# Patient Record
Sex: Female | Born: 2000 | Race: White | Hispanic: Yes | Marital: Single | Smoking: Never smoker
Health system: Southern US, Community
[De-identification: ages and names within clinical notes are randomized; demographics above are authoritative.]

## PROBLEM LIST (undated history)

## (undated) DIAGNOSIS — J45909 Unspecified asthma, uncomplicated: Secondary | ICD-10-CM

## (undated) DIAGNOSIS — T7840XA Allergy, unspecified, initial encounter: Secondary | ICD-10-CM

## (undated) DIAGNOSIS — F419 Anxiety disorder, unspecified: Secondary | ICD-10-CM

## (undated) DIAGNOSIS — L309 Dermatitis, unspecified: Secondary | ICD-10-CM

## (undated) DIAGNOSIS — L509 Urticaria, unspecified: Secondary | ICD-10-CM

## (undated) DIAGNOSIS — G8929 Other chronic pain: Secondary | ICD-10-CM

## (undated) DIAGNOSIS — R519 Headache, unspecified: Secondary | ICD-10-CM

## (undated) DIAGNOSIS — N200 Calculus of kidney: Secondary | ICD-10-CM

## (undated) HISTORY — DX: Other chronic pain: G89.29

## (undated) HISTORY — PX: NO PAST SURGERIES: SHX2092

## (undated) HISTORY — DX: Headache, unspecified: R51.9

## (undated) HISTORY — PX: WISDOM TOOTH EXTRACTION: SHX21

## (undated) HISTORY — DX: Unspecified asthma, uncomplicated: J45.909

## (undated) HISTORY — DX: Urticaria, unspecified: L50.9

## (undated) HISTORY — DX: Anxiety disorder, unspecified: F41.9

## (undated) HISTORY — DX: Calculus of kidney: N20.0

## (undated) HISTORY — DX: Allergy, unspecified, initial encounter: T78.40XA

## (undated) HISTORY — DX: Dermatitis, unspecified: L30.9

---
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
## 2019-01-20 ENCOUNTER — Other Ambulatory Visit: Payer: Self-pay

## 2019-01-20 MED ORDER — MONTELUKAST SODIUM 10 MG PO TABS: 10.0000 mg | ORAL_TABLET | Freq: Every day | ORAL | 3 refills | Status: DC

## 2019-01-26 ENCOUNTER — Ambulatory Visit: Payer: BC Managed Care – PPO | Admitting: Allergy and Immunology

## 2019-09-22 IMAGING — US US TRANSVAGINAL NON-OB
1 series · 13 of 25 positions shown · non-contrast
Comparison: None in PACs

CLINICAL DATA: Onset of right lower quadrant pain this morning.

EXAM:
TRANSABDOMINAL AND TRANSVAGINAL ULTRASOUND OF PELVIS
DOPPLER ULTRASOUND OF OVARIES
TECHNIQUE: Both transabdominal and transvaginal ultrasound examinations of the
pelvis were performed. Transabdominal technique was performed for
global imaging of the pelvis including uterus, ovaries, adnexal
regions, and pelvic cul-de-sac.
It was necessary to proceed with endovaginal exam following the
transabdominal exam to visualize the ovaries and adnexal regions..
Color and duplex Doppler ultrasound was utilized to evaluate blood
flow to the ovaries.

[Series 1: us transvaginal non-ob · 0.18mm/px · 125 acquisitions, 13 frames shown]
[im 1/125]
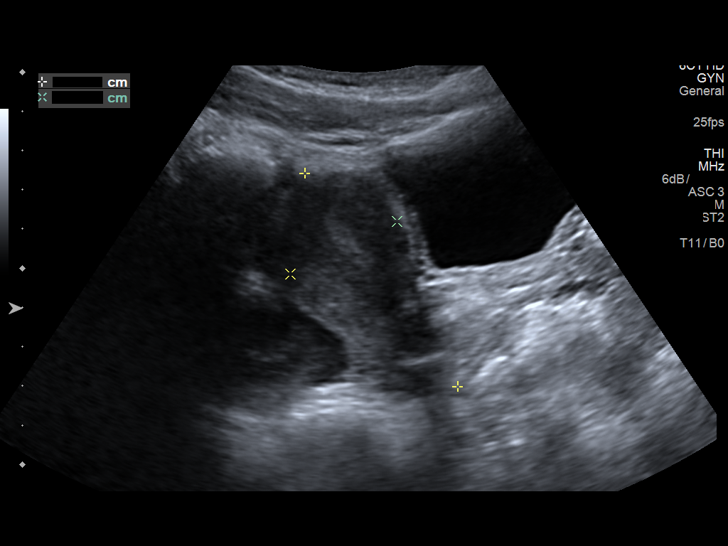
[im 11/125]
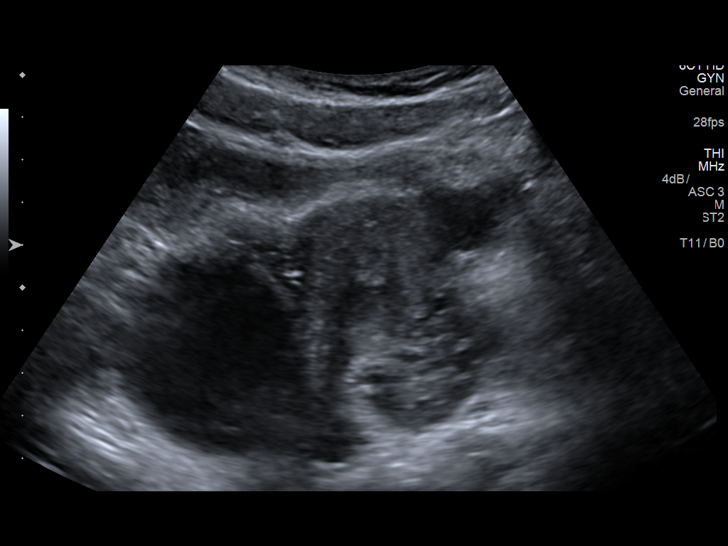
[im 21/125]
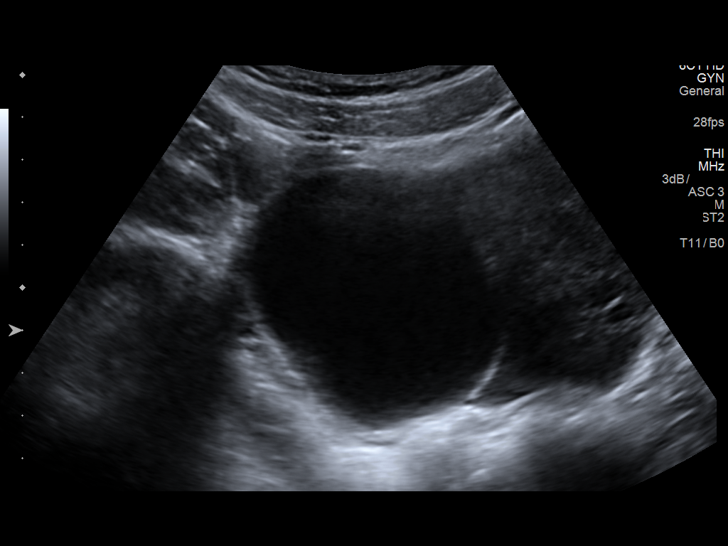
[im 32/125]
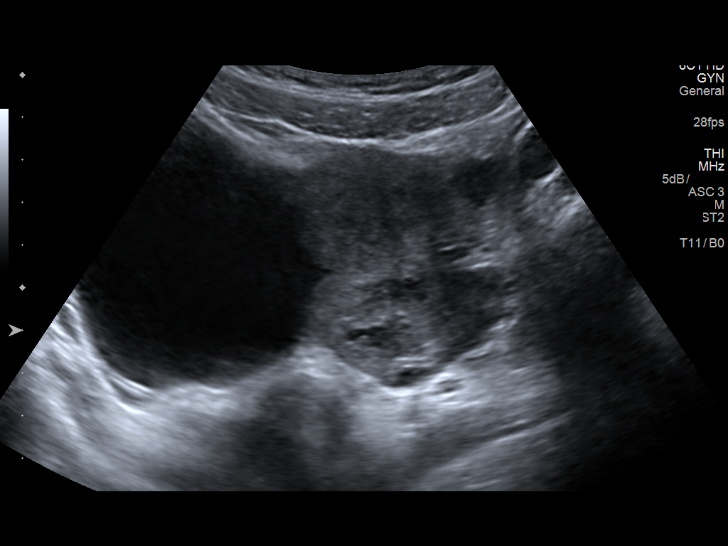
[im 42/125]
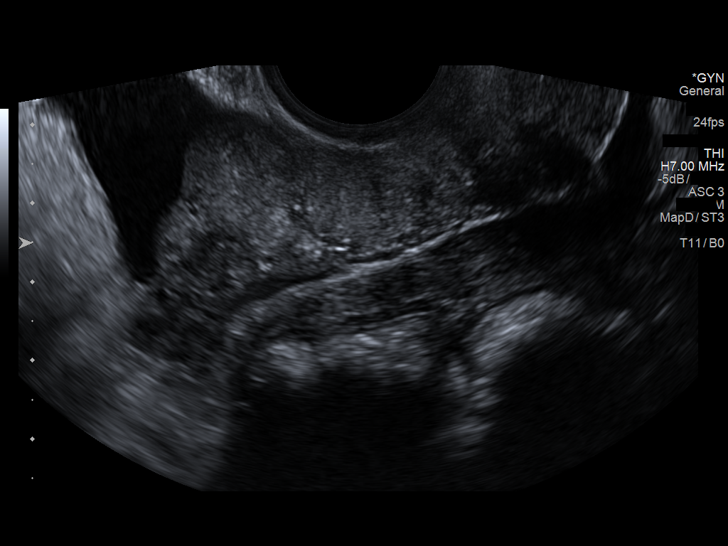
[im 52/125]
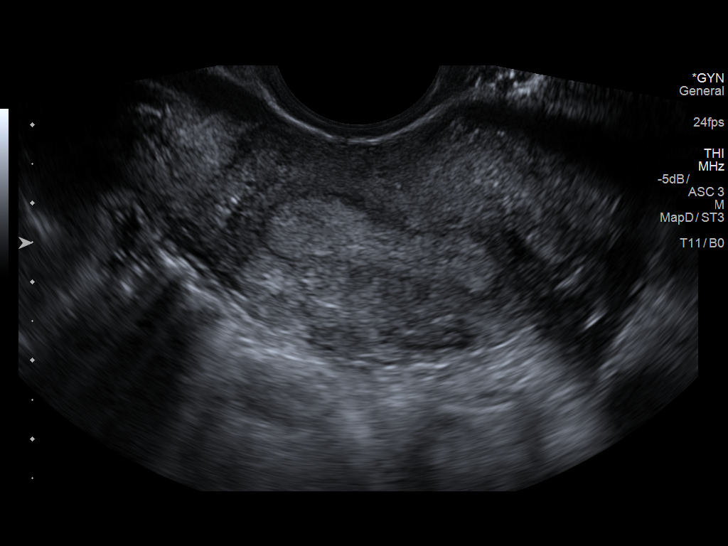
[im 63/125]
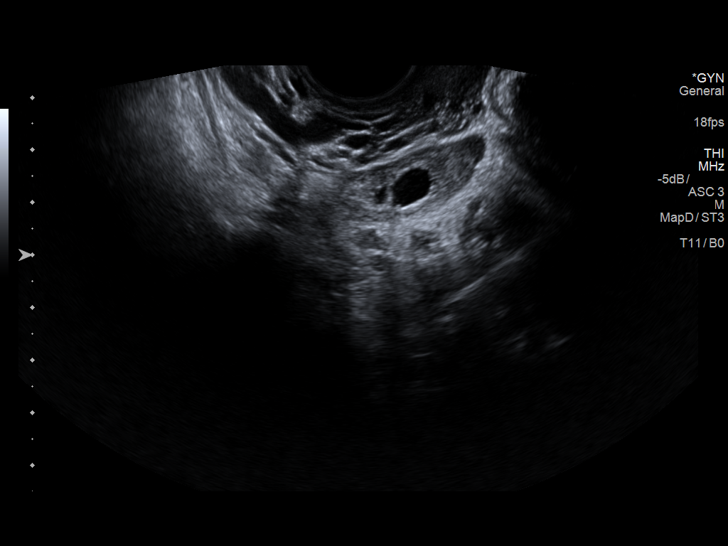
[im 73/125]
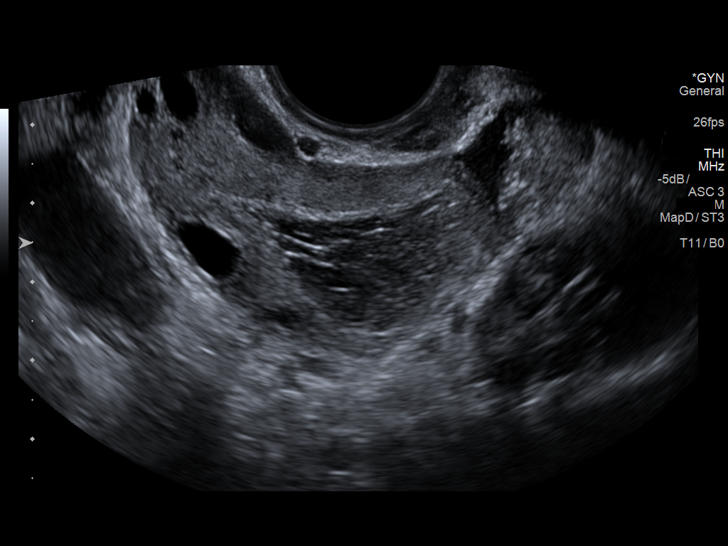
[im 83/125]
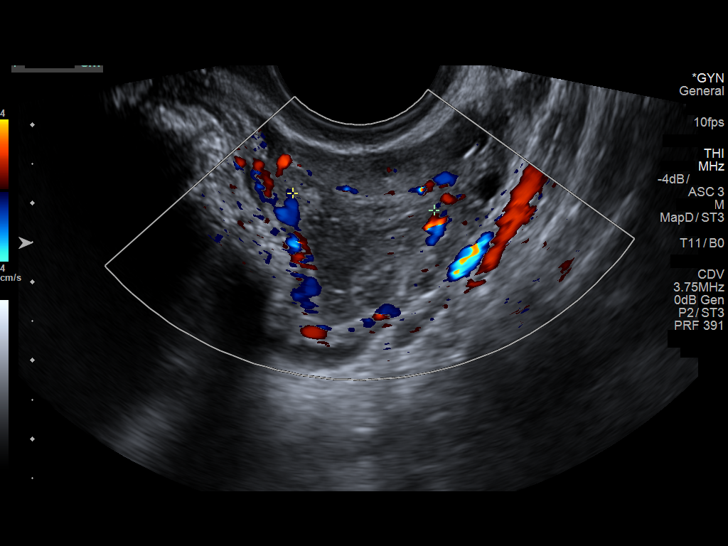
[im 94/125]
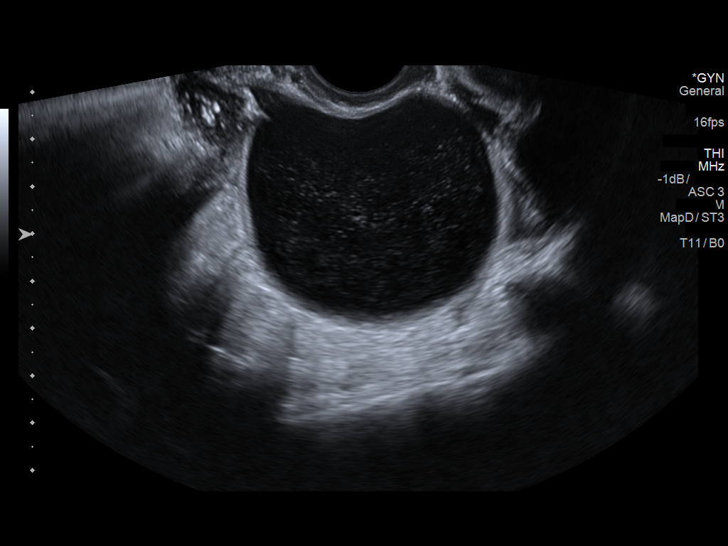
[im 104/125]
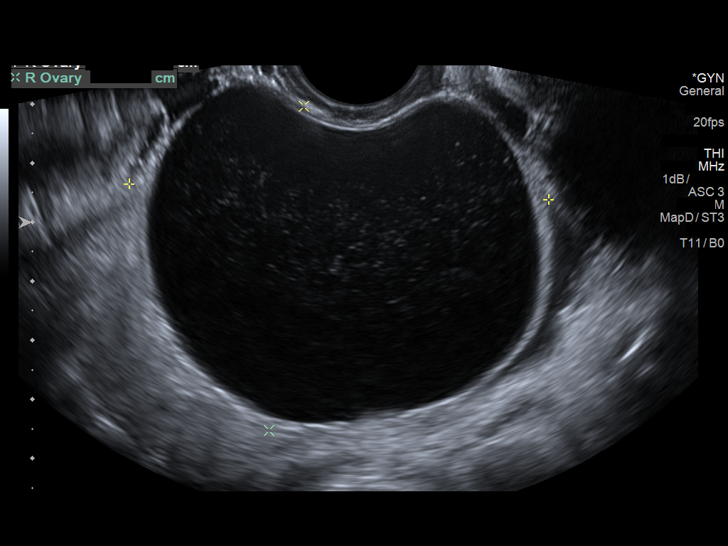
[im 114/125]
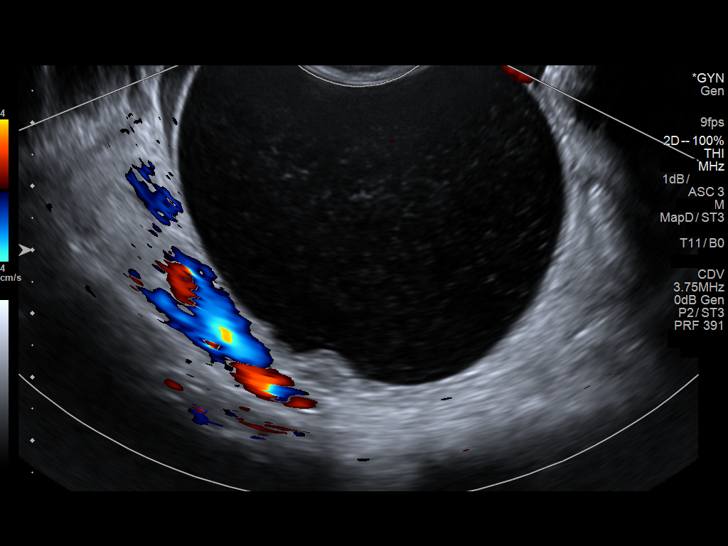
[im 125/125]
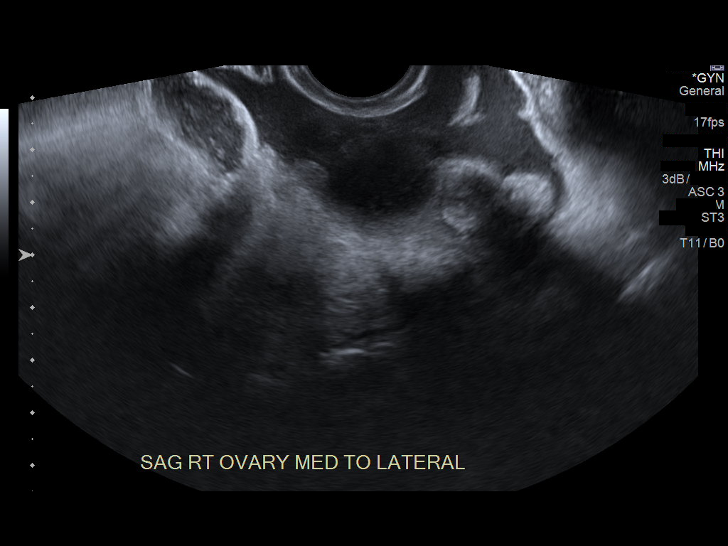

[13 of 25 positions shown; findings below may reference images not displayed]

FINDINGS: Uterus

Measurements: 7.2 x 3.5 x 5.7 cm. No fibroids or other mass
visualized.

Endometrium

Thickness: 7.1 mm.  No focal abnormality visualized.

Right ovary

Measurements: 7.1 x 5.5 x 6.5 cm.. There is a complex cystic
appearing focus closely applied to the right ovary measuring 5.9 x
5.7 x 6.1 cm with a 1.1 cm mural nodule posteriorly.

Left ovary

Measurements: 5.2 x 2.7 x 3.5 cm. There is a cystic structure with
internal echoes associated with the left ovary which measures 2.9 x
1.7 x 1.8 cm.

Pulsed Doppler evaluation of both ovaries demonstrates normal
low-resistance arterial and venous waveforms.

Other findings

There is a moderate amount of free pelvic fluid.
IMPRESSION: No evidence of ovarian torsion.

Abnormal complex appearing cystic mass arising from or closely
applied to the right ovary which measures 5.9 x 5.7 x 6.1 cm. It
contains a mural nodule measuring 1.1 cm in greatest dimension which
is not hypervascular. Given the age group this is likely a benign
lesion such is a cystadenofibroma or endometrioma or hemorrhagic
cyst.

Smaller structure within the left ovary measuring 2.9 x 1.8 x 1.7 cm
which may reflect a collapsing hemorrhagic cyst.

Normal appearing uterus and endometrium.

Moderate amount of free pelvic fluid.

Follow-up ultrasound in 6-12 weeks is recommended.

## 2020-01-13 ENCOUNTER — Telehealth: Payer: Self-pay | Admitting: Allergy and Immunology

## 2020-01-13 NOTE — Telephone Encounter (Signed)
Lm for pt to call us back  

## 2020-01-13 NOTE — Telephone Encounter (Signed)
Spoke with pt she was confused on what medications to take and what for. Went over meds with her. Told her to go back and try the pataday otc, add in mucinex, do the flonase, nasal saline rinses montelukast and xyzal and follow up with [place] in a few days. Pt stated understanding. Pt does not take medicine on the regular basis. She says her breathing is fine so does not do inhalers. Informed pt we always have a provider on call if she needs [place].

## 2020-01-13 NOTE — Telephone Encounter (Signed)
Patient says that her allergies are very bad. Her medication has stopped working. Eyes are swollen and watery. She would like to know what she can do next.

## 2020-01-31 ENCOUNTER — Telehealth: Payer: Self-pay

## 2020-01-31 NOTE — Telephone Encounter (Signed)
Pt scheduled tomorrow at 1:30pm .

## 2020-01-31 NOTE — Telephone Encounter (Signed)
Patient going to [employer]-campus student [employer] today.

## 2020-01-31 NOTE — Telephone Encounter (Signed)
Please advise 

## 2020-01-31 NOTE — Telephone Encounter (Signed)
I have an appt open tomorrow at 130 and 400. If those will not work for her then she can try to be worked into another provider schedule.

## 2020-01-31 NOTE — Telephone Encounter (Signed)
Patient is experiencing right ankle pain. It feels the same as last office visit. Please call.

## 2020-01-31 NOTE — Telephone Encounter (Signed)
Pt has not been seen since 09/2018 regarding this issue. Will need another visit w/ PCP please.

## 2020-01-31 NOTE — Telephone Encounter (Signed)
Patient called back.  Requesting to find an appt with another provider within [facility].  I could not find any appts available.  I suggested an urgent care or her student health.  She said her insurance does not pay at either.  She asked if Dr. Claiborne Billings had something available tomorrow morning.  I told her no, not at this time.  She began to cry.  Her work is requesting a note.  She has to stand for 6-8 hours and cannot.  Please advise.

## 2020-02-01 ENCOUNTER — Ambulatory Visit: Payer: BC Managed Care – PPO | Admitting: Family Medicine

## 2020-02-01 ENCOUNTER — Other Ambulatory Visit: Payer: Self-pay

## 2020-02-01 VITALS — BP 88/49 | HR 93 | Temp 98.4°F | Resp 12 | Ht 66.0 in | Wt 118.6 lb

## 2020-02-01 DIAGNOSIS — M7752 Other enthesopathy of left foot: Secondary | ICD-10-CM | POA: Diagnosis not present

## 2020-02-01 MED ORDER — NAPROXEN 500 MG PO TABS: 500.0000 mg | ORAL_TABLET | Freq: Two times a day (BID) | ORAL | 0 refills | Status: DC

## 2020-02-01 MED ORDER — FLA ADJUST AIR ANKLE WALKER MISC: 0 refills | Status: DC

## 2020-02-01 NOTE — Patient Instructions (Addendum)
Naproxen every 12 hours for 1 week with food, then you can use as needed. Every 12 hours.   If you can not find orthopedic supplies then we can always resort to [employer].  This is what you need: [employer] Low Top Non-Air Fracture Boot (Small, [id number] and [phone]), Short CAM Walking Brace for Foot and Ankle, Black, Men's Shoe Size 4 1/2-7, Female Shoe Size 6-8  Use CAM walker for 2 weeks.

## 2020-02-01 NOTE — Progress Notes (Signed)
SUBJECTIVE Chief Complaint  Patient presents with  . Foot Pain    right    HPI: Stacey Moss is 19 y.o. patient presents today with recurrent right foot pain.  Patient has been seen a few times in the past last time April 2020.  She has a history of intermetatarsal bursitis.  She has been seen by sports medicine and orthopedics in the past.  During flares she usually is prescribed naproxen and placed in a cam boot walker for 2 weeks.  She reports she no longer has the cam boot walker.  She took Tylenol she has not started an anti-inflammatory.  She was seen at the priority care/urgent care in which an x-ray was completed and patient was placed in a ankle wrap.  X-ray was normal.  Prior note: Presents today for virtual visit to discuss her left foot pain.  She had a similar presentation in the fall of last year with suspected stress fracture.  Patient was provided with NSAIDs and cam walker.  MRI was completed which did not show evidence of a fracture.  Did result with a linear T2 hyperintensity signal between the third and fourth metatarsal heads and lesser  extent the second and third consistent with mild intermetatarsal bursitis. Family last week after jumping on a trampoline, she noticed that it was starting to cause her discomfort again.  She states it initially was swollen, but usually swells in the evening.  Today there is no swelling but she had moderate discomfort with weightbearing.  She has not taken anything for the discomfort.  The area of pain is located same area over the third and fourth metatarsals up to her distal ankle.  ROS: See pertinent positives and negatives per HPI.  Patient Active Problem List   Diagnosis Date Noted  . Moderate persistent asthma 11/10/2018  . Allergic conjunctivitis 11/10/2018  . Atopic dermatitis 11/10/2018  . History of food allergy 11/10/2018  . Reactive airway disease 10/29/2018  . Symptom of wheezing 10/29/2018  . Bursitis of  intermetatarsal bursa of left foot 09/18/2018  . Perennial allergic rhinitis 03/21/2015  . 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  . Well child visit 03/21/2015    Social History   Tobacco Use  . Smoking status: Never Smoker  . Smokeless tobacco: Never Used  Substance Use Topics  . Alcohol use: No    Alcohol/week: 0.0 standard drinks    Current Outpatient Medications:  .  azelastine (ASTELIN) 0.1 % nasal spray, Place 1-2 sprays into both nostrils 2 (two) times daily., Disp: 30 mL, Rfl: 5 .  fluorometholone (FML) 0.1 % ophthalmic suspension, Place 1 drop into both eyes as needed (allergies). , Disp: , Rfl: 0 .  fluticasone (FLONASE) 50 MCG/ACT nasal spray, Place 2 sprays into both nostrils daily., Disp: 16 g, Rfl: 6 .  levocetirizine (XYZAL) 5 MG tablet, Take 1 tablet (5 mg total) by mouth every evening., Disp: 30 tablet, Rfl: 2 .  montelukast (SINGULAIR) 10 MG tablet, Take 1 tablet (10 mg total) by mouth at bedtime., Disp: 30 tablet, Rfl: 3 .  Olopatadine HCl (PAZEO) 0.7 % SOLN, Place 1 drop into both eyes daily as needed., Disp: 1 Bottle, Rfl: 5  No Known Allergies  OBJECTIVE: BP (!) 88/49 (BP Location: Left Arm, Cuff Size: Normal)   Pulse 93   Temp 98.4 F (36.9 C) (Oral)   Resp 12   Ht 5\' 6"  (1.676 m)   Wt 118 lb 9.6 oz (53.8 kg)   LMP 01/25/2020  SpO2 98%   BMI 19.14 kg/m  Gen: Afebrile. No acute distress.  HENT: AT. [place]. Eyes:Pupils Equal Round Reactive to light, Extraocular movements intact,  Conjunctiva without redness, discharge or icterus. MSK: No bruising, no swelling.  No erythema of right foot or ankle.  Tender to palpation over metatarsals and lateral posterior foot.  Full range of motion.  Discomfort with posterior drawer of ankle.  Neurovascular intact distally.   ASSESSMENT AND PLAN: Stacey Moss is a 19 y.o. female present for  Left foot pain/Bursitis of intermetatarsal bursa of left foot -Seems like her foot flares anytime she has increased activity.  Currently she is  started playing ultimate Frisbee. Rest, ice, CAM Walker for 2 weeks, NSAIDs.  Keep elevated when possible. Naproxen twice daily was prescribed for her today. -Patient was provided with prescription to obtain new cam walker from orthopedic supply. -May discontinue CAM Walker in 2 weeks and use ankle support.  If pain returns at that time suggest she follow-up with her sports med doc to see next steps.  She is agreeable with plan.   Orders Placed This Encounter  Procedures  . For home use only DME Other see comment   Meds ordered this encounter  Medications  . Misc. Devices (FLA ADJUST AIR ANKLE WALKER) MISC    Sig: 1 short CAM walker to fit.    Dispense:  1 each    Refill:  0  . naproxen (NAPROSYN) 500 MG tablet    Sig: Take 1 tablet (500 mg total) by mouth 2 (two) times daily with a meal.    Dispense:  30 tablet    Refill:  0     Erika Slaby, DO 02/01/2020

## 2020-05-16 ENCOUNTER — Telehealth: Payer: Self-pay | Admitting: Family Medicine

## 2020-05-16 MED ORDER — LEVOCETIRIZINE DIHYDROCHLORIDE 5 MG PO TABS: 5.0000 mg | ORAL_TABLET | Freq: Every evening | ORAL | 0 refills | Status: DC

## 2020-05-16 MED ORDER — MONTELUKAST SODIUM 10 MG PO TABS: 10.0000 mg | ORAL_TABLET | Freq: Every day | ORAL | 0 refills | Status: DC

## 2020-05-16 NOTE — Telephone Encounter (Signed)
Patient is requesting refills of her levocetirizine and montelukast. Please send to [facility] in [facility].

## 2020-05-16 NOTE — Telephone Encounter (Signed)
rx sent. MUST HAVE OV FOR FURTHER REFILLS

## 2020-06-17 ENCOUNTER — Other Ambulatory Visit: Payer: Self-pay | Admitting: Family Medicine

## 2020-06-24 IMAGING — DX DG FOOT COMPLETE 3+V*L*
4 series · 4 of 4 positions shown · non-contrast
Comparison: Left foot series of February 13, 2018

CLINICAL DATA: Left foot injury 2 months ago with persistent dorsal
foot pain.

EXAM:
LEFT FOOT - COMPLETE 3+ VIEW

[foot ap (1 of 2)]
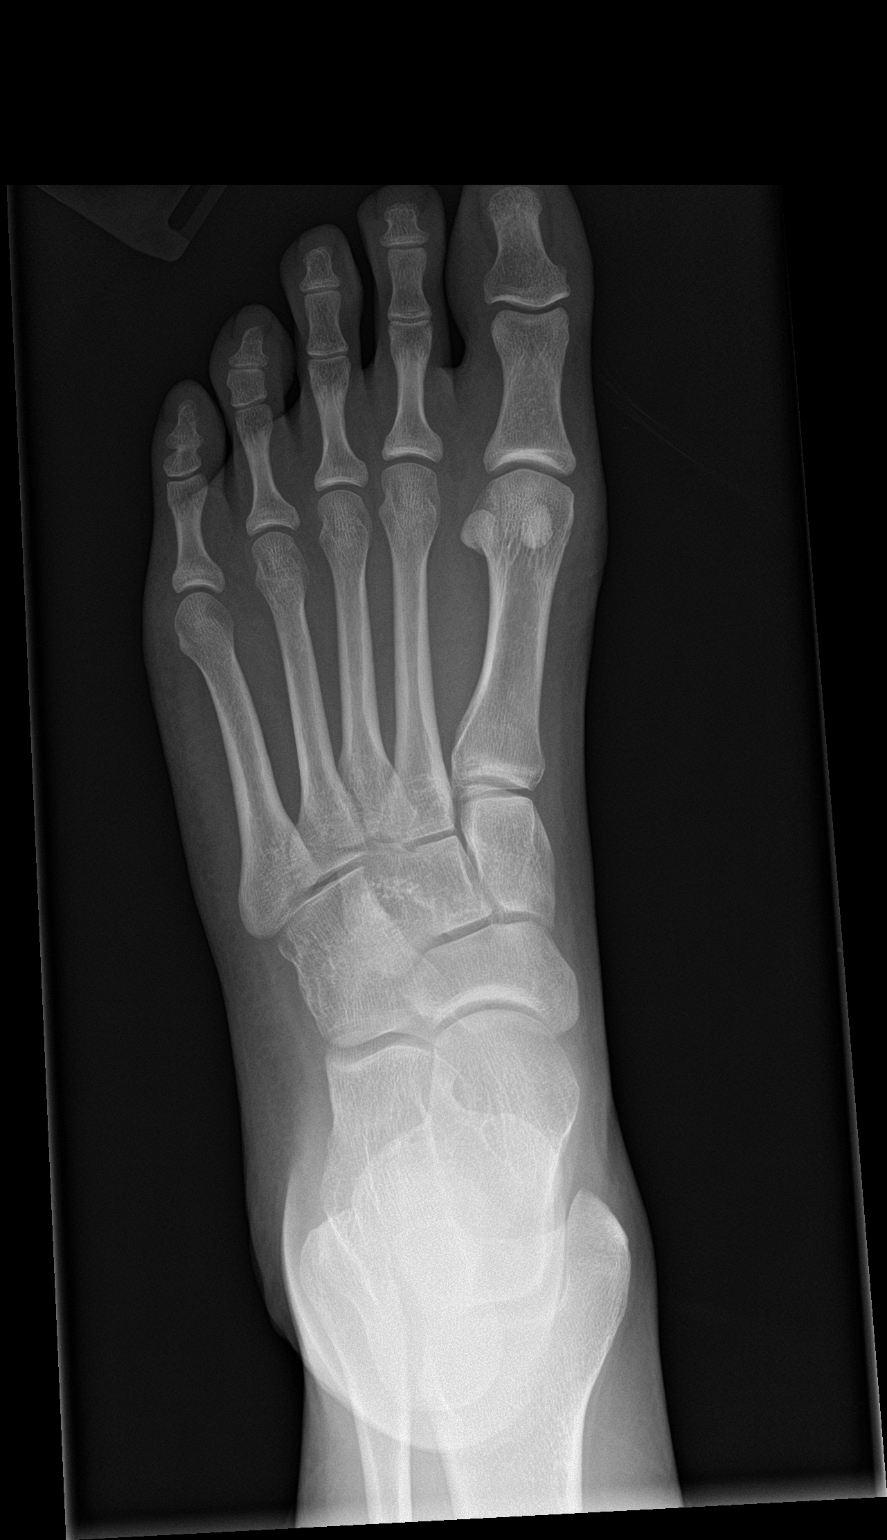

[foot obl]
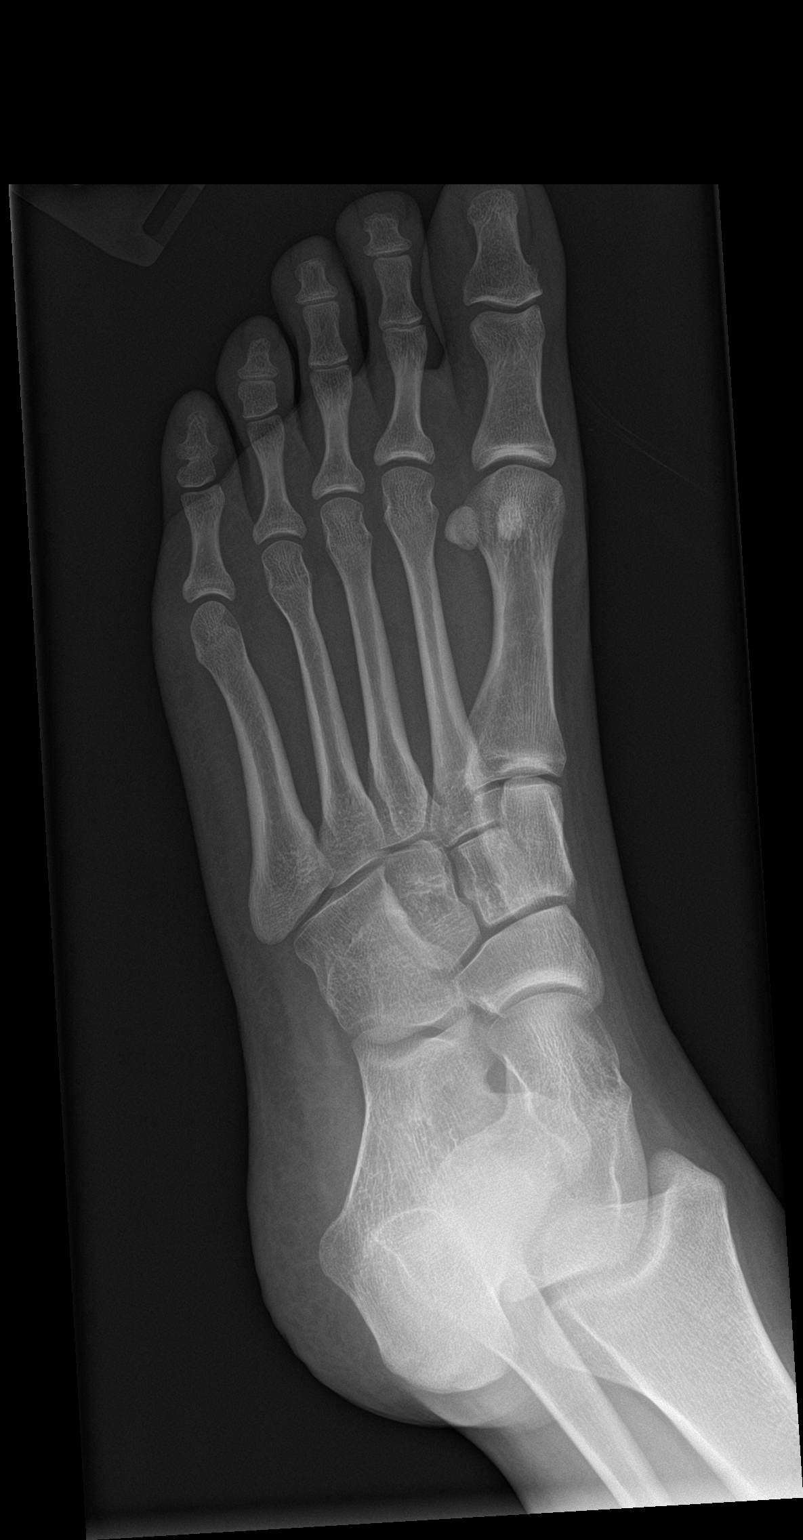

[foot lat]
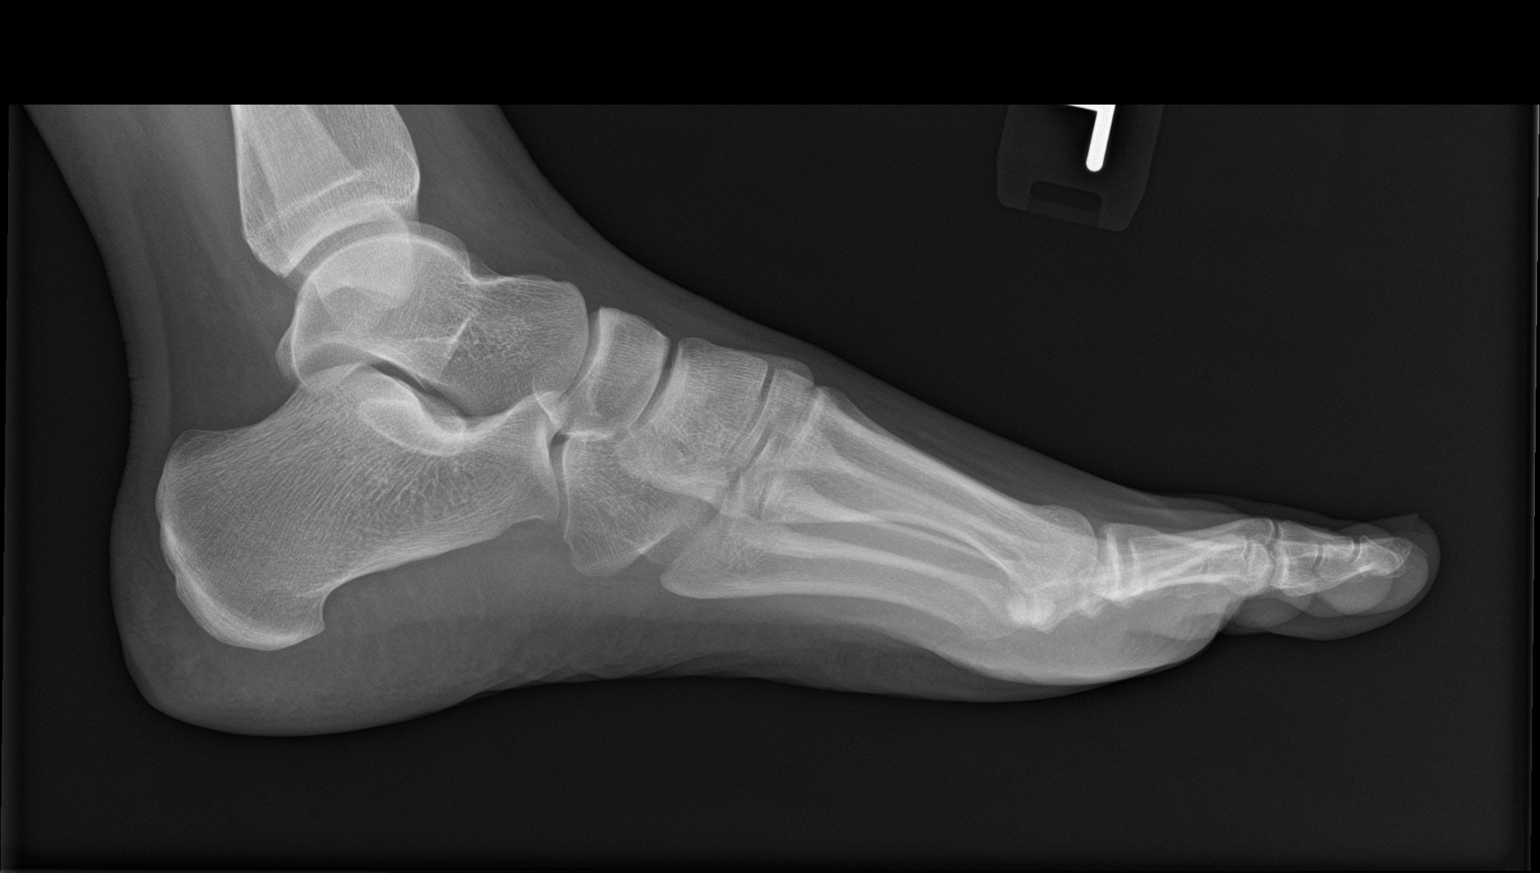

[foot ap (2 of 2)]
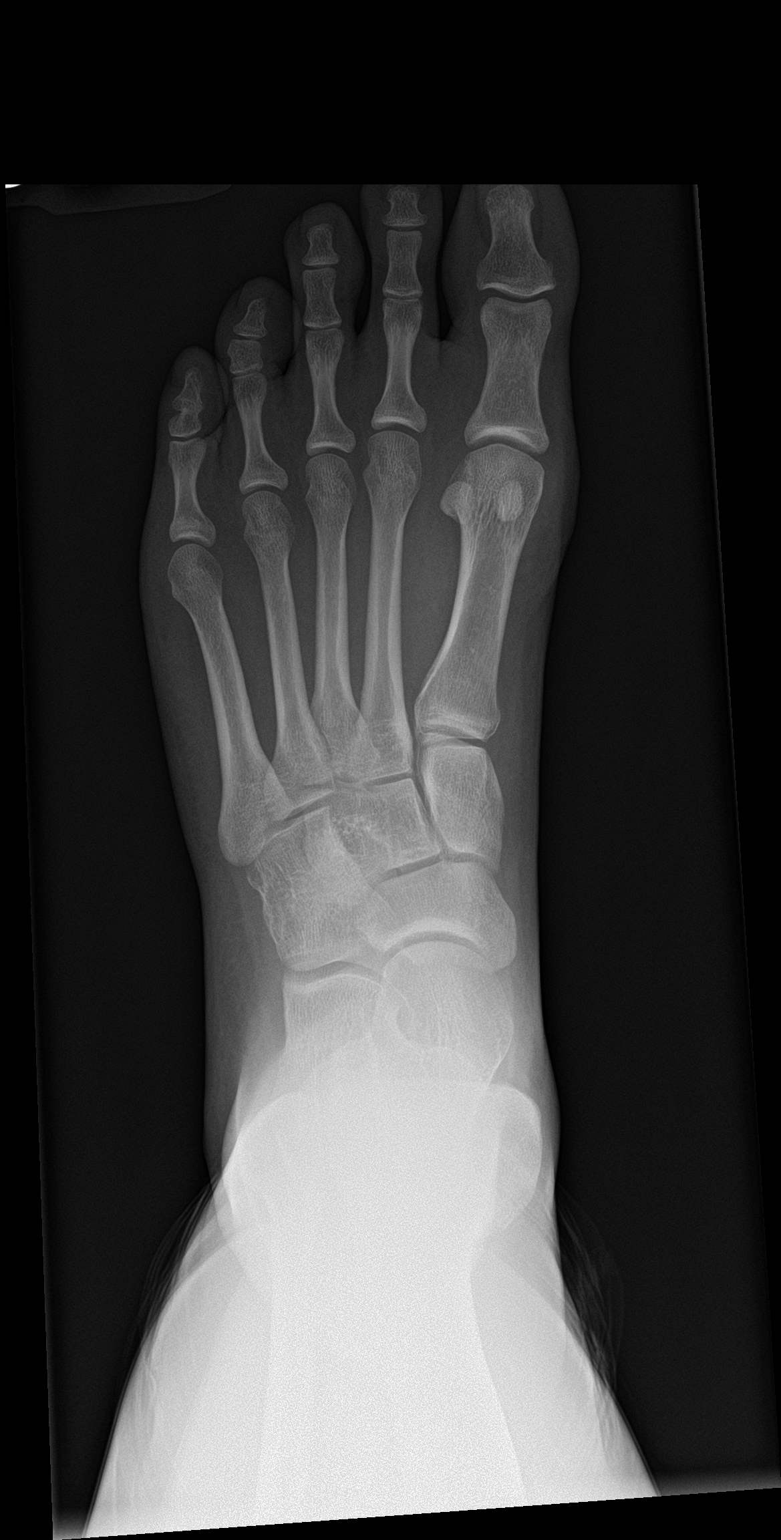

[4 of 4 positions shown; findings below may reference images not displayed]

FINDINGS: The bones are subjectively adequately mineralized. Specific
attention to the tarsometatarsal joint region reveals no findings
suspicious 4 list from a fracture dislocation. The interphalangeal
and MTP joints are preserved. No lytic or blastic bony lesions are
observed. There is no periosteal reaction.
IMPRESSION: No acute or healing fracture or dislocation of the left foot is
observed. Given the persistent symptoms, MRI would be a useful next
imaging step step in an effort to further evaluate the supporting
soft tissues.

## 2020-07-26 ENCOUNTER — Encounter: Payer: Self-pay | Admitting: Family Medicine

## 2020-07-26 ENCOUNTER — Telehealth (INDEPENDENT_AMBULATORY_CARE_PROVIDER_SITE_OTHER): Payer: BC Managed Care – PPO | Admitting: Family Medicine

## 2020-07-26 ENCOUNTER — Other Ambulatory Visit: Payer: Self-pay

## 2020-07-26 ENCOUNTER — Ambulatory Visit (INDEPENDENT_AMBULATORY_CARE_PROVIDER_SITE_OTHER): Payer: BC Managed Care – PPO

## 2020-07-26 DIAGNOSIS — J029 Acute pharyngitis, unspecified: Secondary | ICD-10-CM

## 2020-07-26 DIAGNOSIS — J301 Allergic rhinitis due to pollen: Secondary | ICD-10-CM | POA: Diagnosis not present

## 2020-07-26 DIAGNOSIS — R591 Generalized enlarged lymph nodes: Secondary | ICD-10-CM

## 2020-07-26 LAB — POCT RAPID STREP A (OFFICE): Rapid Strep A Screen: NEGATIVE

## 2020-07-26 LAB — POCT INFLUENZA A/B
Influenza A, POC: NEGATIVE
Influenza B, POC: NEGATIVE

## 2020-07-26 MED ORDER — FLUTICASONE PROPIONATE 50 MCG/ACT NA SUSP: 2.0000 | Freq: Two times a day (BID) | NASAL | 6 refills | Status: DC

## 2020-07-26 MED ORDER — LEVOCETIRIZINE DIHYDROCHLORIDE 5 MG PO TABS: 5.0000 mg | ORAL_TABLET | Freq: Every evening | ORAL | 3 refills | Status: DC

## 2020-07-26 MED ORDER — MONTELUKAST SODIUM 10 MG PO TABS: 10.0000 mg | ORAL_TABLET | Freq: Every day | ORAL | 3 refills | Status: DC

## 2020-07-26 NOTE — Progress Notes (Signed)
VIRTUAL VISIT VIA VIDEO  I connected with Stacey Moss on 07/26/20 at  2:00 PM EST by elemedicine application and verified that I am speaking with the correct person using two identifiers. Location patient: Home Location provider: [facility], Office Persons participating in the virtual visit: Patient, Dr. Claiborne Billings and Paulina Fusi, CMA  I discussed the limitations of evaluation and management by telemedicine and the availability of in person appointments. The patient expressed understanding and agreed to proceed.   SUBJECTIVE Chief Complaint  Patient presents with  . Sore Throat    Pt c/o cough, sore throat, nasal congestion, and HA x 5 days; Neg COVID test 2/16    HPI: Stacey Moss is a 20 y.o. female present for acute illness of 5 days duration. She denies no fever, chills, nausea or diarrhea.  Endorses: PND, sore throat, tickle clearing of throat and temporal  Headaches. she reports throat is swollen and red when she looked in the mirror and tender cervical lymph nodes. She is not taking any antihistamines currently. She has a h/o seasonal allergies.  Exposure: no sick contacts.  OTC: dayquil Vaccines: declined ROS: See pertinent positives and negatives per HPI.  Patient Active Problem List   Diagnosis Date Noted  . Moderate persistent asthma 11/10/2018  . Allergic conjunctivitis 11/10/2018  . Atopic dermatitis 11/10/2018  . History of food allergy 11/10/2018  . Reactive airway disease 10/29/2018  . Symptom of wheezing 10/29/2018  . Bursitis of intermetatarsal bursa of left foot 09/18/2018  . Perennial allergic rhinitis 03/21/2015  . 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  . Well child visit 03/21/2015    Social History   Tobacco Use  . Smoking status: Never Smoker  . Smokeless tobacco: Never Used  Substance Use Topics  . Alcohol use: No    Alcohol/week: 0.0 standard drinks    Current Outpatient Medications:  .  naproxen (NAPROSYN) 500 MG tablet, Take 1 tablet (500 mg total)  by mouth 2 (two) times daily with a meal., Disp: 30 tablet, Rfl: 0 .  azelastine (ASTELIN) 0.1 % nasal spray, Place 1-2 sprays into both nostrils 2 (two) times daily. (Patient not taking: Reported on 07/26/2020), Disp: 30 mL, Rfl: 5 .  fluorometholone (FML) 0.1 % ophthalmic suspension, Place 1 drop into both eyes as needed (allergies).  (Patient not taking: Reported on 07/26/2020), Disp: , Rfl: 0 .  fluticasone (FLONASE) 50 MCG/ACT nasal spray, Place 2 sprays into both nostrils in the morning and at bedtime., Disp: 16 g, Rfl: 6 .  levocetirizine (XYZAL) 5 MG tablet, Take 1 tablet (5 mg total) by mouth every evening., Disp: 90 tablet, Rfl: 3 .  montelukast (SINGULAIR) 10 MG tablet, Take 1 tablet (10 mg total) by mouth at bedtime., Disp: 90 tablet, Rfl: 3 .  Olopatadine HCl (PAZEO) 0.7 % SOLN, Place 1 drop into both eyes daily as needed. (Patient not taking: Reported on 07/26/2020), Disp: 1 Bottle, Rfl: 5  No Known Allergies  OBJECTIVE: There were no vitals taken for this visit. Gen: No acute distress. Nontoxic in appearance.  HENT: AT. [place].  MMM.  Eyes:Pupils Equal Round Reactive to light, Extraocular movements intact,  Conjunctiva without redness, discharge or icterus. Chest: Cough not present. No shortness of breath Skin: no rashes, purpura or petechiae.  Neuro:. Alert. Oriented x3  Psych: Normal affect and demeanor. Normal speech. Normal thought content and judgment.  ASSESSMENT AND PLAN: Stacey Moss is a 20 y.o. female present for  Sore throat/Seasonal allergic rhinitis due to pollen/Lymphadenopathy Rest, hydrate.  +  flonase, mucinex (DM if cough), nettie pot or nasal saline.  Restart Singulair, zyrtec and Flonase.  F/U 2 weeks if not improved.  Allergy season has started and most likely the cause of her symptoms- however will rule out covid, strep and flu.  - Novel Coronavirus, NAA ([facility]) - POCT rapid strep A - Culture, Group A Strep - POCT Influenza A/B    Felix Pacini,  DO 07/26/2020   Return if symptoms worsen or fail to improve.  Orders Placed This Encounter  Procedures  . Novel Coronavirus, NAA ([facility])  . Culture, Group A Strep  . POCT rapid strep A  . POCT Influenza A/B   Meds ordered this encounter  Medications  . levocetirizine (XYZAL) 5 MG tablet    Sig: Take 1 tablet (5 mg total) by mouth every evening.    Dispense:  90 tablet    Refill:  3  . fluticasone (FLONASE) 50 MCG/ACT nasal spray    Sig: Place 2 sprays into both nostrils in the morning and at bedtime.    Dispense:  16 g    Refill:  6  . montelukast (SINGULAIR) 10 MG tablet    Sig: Take 1 tablet (10 mg total) by mouth at bedtime.    Dispense:  90 tablet    Refill:  3   Referral Orders  No referral(s) requested today

## 2020-07-26 NOTE — Patient Instructions (Signed)

## 2020-07-27 LAB — SARS-COV-2, NAA 2 DAY TAT

## 2020-07-27 LAB — NOVEL CORONAVIRUS, NAA: SARS-CoV-2, NAA: NOT DETECTED

## 2020-07-28 ENCOUNTER — Telehealth: Payer: Self-pay | Admitting: Family Medicine

## 2020-07-28 LAB — CULTURE, GROUP A STREP
MICRO NUMBER:: 11572929
SPECIMEN QUALITY:: ADEQUATE

## 2020-07-28 MED ORDER — DOXYCYCLINE HYCLATE 100 MG PO TABS: 100.0000 mg | ORAL_TABLET | Freq: Two times a day (BID) | ORAL | 0 refills | Status: DC

## 2020-07-28 NOTE — Telephone Encounter (Signed)
Since she is about the same but not worsening> I called in a prescription for doxycycline twice daily for her to pick up this week and start only if she is feeling worse.  We will call her once we get her throat culture.

## 2020-07-28 NOTE — Telephone Encounter (Signed)
Spoke with pt regarding labs and instructions. Pt states she feels about the same as OV

## 2020-07-28 NOTE — Telephone Encounter (Signed)
Please inform patient her Covid test is negative. Her throat culture may not be completed by the end of the day.  Please, ask her how she is feeling today after starting antihistamines.  Better, worse, same?   -If she is worse, or is having fevers we can call in an antibiotic to start.  Otherwise, would want her to continue the antihistamines, rest and hydration and see how she is feeling on Monday.  Please advise of her answer.  Thanks

## 2020-07-28 NOTE — Telephone Encounter (Signed)
Spoke with pt with providers instructions. Pt verbalized understanding

## 2020-07-28 NOTE — Addendum Note (Signed)
Addended by: Felix Pacini A on: 07/28/2020 11:06 AM   Modules accepted: Orders

## 2022-07-22 ENCOUNTER — Other Ambulatory Visit: Payer: Self-pay

## 2022-07-22 ENCOUNTER — Ambulatory Visit (HOSPITAL_COMMUNITY)
Admission: RE | Admit: 2022-07-22 | Discharge: 2022-07-22 | Disposition: A | Discharge status: Home / Self Care | Payer: Medicaid Other | Source: Ambulatory Visit | Attending: Physician Assistant | Admitting: Physician Assistant

## 2022-07-22 ENCOUNTER — Encounter (HOSPITAL_COMMUNITY): Payer: Self-pay

## 2022-07-22 VITALS — BP 116/61 | HR 96 | Temp 98.4°F | Resp 18

## 2022-07-22 DIAGNOSIS — J029 Acute pharyngitis, unspecified: Secondary | ICD-10-CM | POA: Diagnosis not present

## 2022-07-22 LAB — POCT RAPID STREP A, ED / UC: Streptococcus, Group A Screen (Direct): NEGATIVE

## 2022-07-22 MED ORDER — LIDOCAINE VISCOUS HCL 2 % MT SOLN: 15.0000 mL | Freq: Four times a day (QID) | OROMUCOSAL | 0 refills | Status: DC | PRN

## 2022-07-22 NOTE — ED Triage Notes (Signed)
Complains of sore throat and ear pain for 2 days, particularly at night.  Pain has now been all day yesterday and today.  Both ears hurt, but left ear hurts the post.  Denies stuffy ears.  Denies using any ear drops.  Has used zyrtec

## 2022-07-22 NOTE — Discharge Instructions (Addendum)
Your strep test was negative in clinic today.  We will contact you if your culture is positive we will need to start antibiotics.  Gargle with warm salt water and alternate Tylenol and ibuprofen.  Use viscous lidocaine up to 4 times a day.  Do not eat or drink immediately after using this medication as it increases the risk of choking.  I would recommend you continue your allergy medication.  Make sure that you rest and drink plenty of fluid.  If your symptoms or not improving within a week return for reevaluation.  If anything worsens and you have difficulty swallowing, difficulty breathing, swelling of your throat, high fever, shortness of breath you should be seen immediately.

## 2022-07-22 NOTE — ED Provider Notes (Signed)
[place]    CSN: [id number] Arrival date & time: 07/22/22  1524      History   Chief Complaint Chief Complaint  Patient presents with   Sore Throat   Appointment    3:30    HPI Stacey Moss is a 22 y.o. female.   Patient presents today with a 3+ day history of sore throat and bilateral otalgia.  She initially thought symptoms were related to allergies that she has had some mild nasal congestion but has been taking her cetirizine without improvement of symptoms.  She has not tried additional over-the-counter medication for symptom management.  She does have a history of asthma but reports that this is allergy induced and she has not required regular use of albuterol since symptoms began.  She denies any known sick contacts but does work at [employer] is exposed to many people.  Denies any recent antibiotics or steroids.  She has had COVID in 2021.  She has not had COVID-19 vaccinations.  Denies any recent antibiotics or steroids.  She is eating and drinking normally.    Past Medical History:  Diagnosis Date   Allergy    seasonal   Asthma    Eczema    Urticaria     Patient Active Problem List   Diagnosis Date Noted   Moderate persistent asthma 11/10/2018   Allergic conjunctivitis 11/10/2018   Atopic dermatitis 11/10/2018   History of food allergy 11/10/2018   Reactive airway disease 10/29/2018   Symptom of wheezing 10/29/2018   Bursitis of intermetatarsal bursa of left foot 09/18/2018   Perennial allergic rhinitis 123XX123   TMJ click 123XX123   Well child visit 03/21/2015    Past Surgical History:  Procedure Laterality Date   WISDOM TOOTH EXTRACTION      OB History     Gravida  0   Para  0   Term  0   Preterm  0   AB  0   Living  0      SAB  0   IAB  0   Ectopic  0   Multiple  0   Live Births  0            Home Medications    Prior to Admission medications   Medication Sig Start Date End Date Taking?  Authorizing Provider  lidocaine (XYLOCAINE) 2 % solution Use as directed 15 mLs in the mouth or throat every 6 (six) hours as needed for mouth pain. Swish and spit 07/22/22  Yes Barton Want K, PA-C  fluorometholone (FML) 0.1 % ophthalmic suspension Place 1 drop into both eyes as needed (allergies).  Patient not taking: Reported on 07/26/2020 12/16/17   [provider]  fluticasone (FLONASE) 50 MCG/ACT nasal spray Place 2 sprays into both nostrils in the morning and at bedtime. Patient not taking: Reported on 07/22/2022 07/26/20   Howard Pouch A, DO  levocetirizine (XYZAL) 5 MG tablet Take 1 tablet (5 mg total) by mouth every evening. Patient not taking: Reported on 07/22/2022 07/26/20   Howard Pouch A, DO  montelukast (SINGULAIR) 10 MG tablet Take 1 tablet (10 mg total) by mouth at bedtime. Patient not taking: Reported on 07/22/2022 07/26/20   Howard Pouch A, DO  Olopatadine HCl (PAZEO) 0.7 % SOLN Place 1 drop into both eyes daily as needed. Patient not taking: Reported on 07/26/2020 11/10/18   Bobbitt, Sedalia Muta, MD    Family History Family History  Problem Relation Age of Onset  Arthritis Mother    Asthma Mother    Asthma Maternal Grandmother    Asthma Maternal Grandfather    Cancer Paternal Grandmother    Breast cancer Paternal Grandmother 37   Allergic rhinitis Brother     Social History Social History   Tobacco Use   Smoking status: Never   Smokeless tobacco: Never  Vaping Use   Vaping Use: Never used  Substance Use Topics   Alcohol use: No    Alcohol/week: 0.0 standard drinks of alcohol   Drug use: No     Allergies   Patient has no known allergies.   Review of Systems Review of Systems  Constitutional:  Positive for activity change. Negative for appetite change, fatigue and fever.  HENT:  Positive for congestion, ear pain and sore throat. Negative for sinus pressure and sneezing.   Respiratory:  Negative for cough and shortness of breath.   Cardiovascular:   Negative for chest pain.  Gastrointestinal:  Negative for abdominal pain, diarrhea, nausea and vomiting.  Neurological:  Negative for dizziness, light-headedness and headaches.     Physical Exam Triage Vital Signs ED Triage Vitals  Enc Vitals Group     BP 07/22/22 1606 116/61     Pulse Rate 07/22/22 1606 96     Resp 07/22/22 1606 18     Temp 07/22/22 1606 98.4 F (36.9 C)     Temp src --      SpO2 07/22/22 1606 98 %     Weight --      Height --      Head Circumference --      Peak Flow --      Pain Score 07/22/22 1603 7     Pain Loc --      Pain Edu? --      Excl. in [place]? --    No data found.  Updated Vital Signs BP 116/61 (BP Location: Left Arm)   Pulse 96   Temp 98.4 F (36.9 C)   Resp 18   LMP 07/02/2022   SpO2 98%   Visual Acuity Right Eye Distance:   Left Eye Distance:   Bilateral Distance:    Right Eye Near:   Left Eye Near:    Bilateral Near:     Physical Exam Vitals reviewed.  Constitutional:      General: She is awake. She is not in acute distress.    Appearance: Normal appearance. She is well-developed. She is not ill-appearing.     Comments: Very pleasant female appears stated age in no acute distress sitting comfortably in exam room  HENT:     Head: Normocephalic and atraumatic.     Right Ear: Tympanic membrane, ear canal and external ear normal. Tympanic membrane is not erythematous or bulging.     Left Ear: Tympanic membrane, ear canal and external ear normal. Tympanic membrane is not erythematous or bulging.     Nose:     Right Sinus: No maxillary sinus tenderness or frontal sinus tenderness.     Left Sinus: No maxillary sinus tenderness or frontal sinus tenderness.     Mouth/Throat:     Pharynx: Uvula midline. Posterior oropharyngeal erythema present. No oropharyngeal exudate.  Cardiovascular:     Rate and Rhythm: Normal rate and regular rhythm.     Heart sounds: Normal heart sounds, S1 normal and S2 normal. No murmur heard. Pulmonary:      Effort: Pulmonary effort is normal.     Breath sounds: Normal breath sounds. No  wheezing, rhonchi or rales.     Comments: Clear to auscultation bilaterally Psychiatric:        Behavior: Behavior is cooperative.      UC Treatments / Results  Labs (all labs ordered are listed, but only abnormal results are displayed) Labs Reviewed  CULTURE, GROUP A STREP [facility])  POCT RAPID STREP A, ED / UC    EKG   Radiology No results found.  Procedures Procedures (including critical care time)  Medications Ordered in UC Medications - No data to display  Initial Impression / Assessment and Plan / UC Course  I have reviewed the triage vital signs and the nursing notes.  Pertinent labs & imaging results that were available during my care of the patient were reviewed by me and considered in my medical decision making (see chart for details).     Patient is well-appearing, afebrile, nontoxic, nontachycardic.  Flu testing was deferred as she has been symptomatic for more than 3 days and outside the window of effectiveness for Tamiflu.  COVID testing was deferred as she has no significant congestion symptoms and already had an at home negative test.  Strep test was obtained and was negative.  Throat culture was obtained and is pending.  Will defer antibiotics until results are available.  Recommended conservative treatment measures including gargling warm salt water and alternating Tylenol ibuprofen.  She was prescribed viscous lidocaine but discussed that she should not eat or drink immediately after using this medication as it will cause an increase in choking.  If her symptoms or not improving within a week she is to return for reevaluation.  If she has any worsening symptoms she needs to be seen immediately including swelling of her throat, shortness of breath, dysphagia, high fever, nausea/vomiting interfering with oral intake.  Strict return precautions given.  Work excuse note  provided.  Final Clinical Impressions(s) / UC Diagnoses   Final diagnoses:  Viral pharyngitis  Sore throat     Discharge Instructions      Your strep test was negative in clinic today.  We will contact you if your culture is positive we will need to start antibiotics.  Gargle with warm salt water and alternate Tylenol and ibuprofen.  Use viscous lidocaine up to 4 times a day.  Do not eat or drink immediately after using this medication as it increases the risk of choking.  I would recommend you continue your allergy medication.  Make sure that you rest and drink plenty of fluid.  If your symptoms or not improving within a week return for reevaluation.  If anything worsens and you have difficulty swallowing, difficulty breathing, swelling of your throat, high fever, shortness of breath you should be seen immediately.     ED Prescriptions     Medication Sig Dispense Auth. Provider   lidocaine (XYLOCAINE) 2 % solution Use as directed 15 mLs in the mouth or throat every 6 (six) hours as needed for mouth pain. Swish and spit 100 mL Asli Tokarski K, PA-C      PDMP not reviewed this encounter.   Terrilee Croak, PA-C 07/22/22 1701

## 2022-07-25 LAB — CULTURE, GROUP A STREP (THRC)

## 2022-09-12 ENCOUNTER — Telehealth: Payer: Self-pay

## 2022-09-12 NOTE — Telephone Encounter (Signed)
fine with me.

## 2022-09-12 NOTE — Telephone Encounter (Signed)
Ok with me 

## 2022-09-12 NOTE — Telephone Encounter (Signed)
Pt would like to transfer from [facility] to [facility]. Please advise.

## 2022-09-30 NOTE — Progress Notes (Unsigned)
   Patient ID: Stacey Moss, female    DOB: 18-Mar-2001, 22 y.o.   MRN: [id number]  No chief complaint on file.   HPI:   Assessment & Plan:  There are no diagnoses linked to this encounter.  Subjective:    Outpatient Medications Prior to Visit  Medication Sig Dispense Refill   fluorometholone (FML) 0.1 % ophthalmic suspension Place 1 drop into both eyes as needed (allergies).  (Patient not taking: Reported on 07/26/2020)  0   fluticasone (FLONASE) 50 MCG/ACT nasal spray Place 2 sprays into both nostrils in the morning and at bedtime. (Patient not taking: Reported on 07/22/2022) 16 g 6   levocetirizine (XYZAL) 5 MG tablet Take 1 tablet (5 mg total) by mouth every evening. (Patient not taking: Reported on 07/22/2022) 90 tablet 3   lidocaine (XYLOCAINE) 2 % solution Use as directed 15 mLs in the mouth or throat every 6 (six) hours as needed for mouth pain. Swish and spit 100 mL 0   montelukast (SINGULAIR) 10 MG tablet Take 1 tablet (10 mg total) by mouth at bedtime. (Patient not taking: Reported on 07/22/2022) 90 tablet 3   Olopatadine HCl (PAZEO) 0.7 % SOLN Place 1 drop into both eyes daily as needed. (Patient not taking: Reported on 07/26/2020) 1 Bottle 5   No facility-administered medications prior to visit.   Past Medical History:  Diagnosis Date   Allergy    seasonal   Asthma    Eczema    Urticaria    Past Surgical History:  Procedure Laterality Date   WISDOM TOOTH EXTRACTION     No Known Allergies    Objective:    Physical Exam Vitals and nursing note reviewed.  Constitutional:      Appearance: Normal appearance.  Cardiovascular:     Rate and Rhythm: Normal rate and regular rhythm.  Pulmonary:     Effort: Pulmonary effort is normal.     Breath sounds: Normal breath sounds.  Musculoskeletal:        General: Normal range of motion.  Skin:    General: Skin is warm and dry.  Neurological:     Mental Status: She is alert.  Psychiatric:        Mood and Affect: Mood  normal.        Behavior: Behavior normal.    There were no vitals taken for this visit. Wt Readings from Last 3 Encounters:  02/01/20 118 lb 9.6 oz (53.8 kg)  11/10/18 120 lb 12.8 oz (54.8 kg) (38 %, Z= -0.30)*  10/29/18 118 lb (53.5 kg) (33 %, Z= -0.45)*   * Growth percentiles are based on CDC (Girls, 2-20 Years) data.       Dulce Sellar, NP

## 2022-10-01 ENCOUNTER — Ambulatory Visit (INDEPENDENT_AMBULATORY_CARE_PROVIDER_SITE_OTHER): Payer: Medicaid Other | Admitting: Family

## 2022-10-01 ENCOUNTER — Encounter: Payer: Self-pay | Admitting: Family

## 2022-10-01 VITALS — BP 111/75 | HR 89 | Temp 97.5°F | Ht 66.0 in | Wt 143.0 lb

## 2022-10-01 DIAGNOSIS — M79605 Pain in left leg: Secondary | ICD-10-CM | POA: Diagnosis not present

## 2022-10-01 NOTE — Patient Instructions (Signed)
Welcome to [facility], It was a pleasure meeting you today!    As discussed, be sure to drink at least 8 cups of water daily = 64oz to avoid cramping. You can add a Powerade O - 12-16oz daily if you are sweating. Continue to stay active, try to do leg/calf strengthening exercises as able. See the handout attached for more tips!  You can schedule a physical with fasting labs at your convenience.    PLEASE NOTE: If you had any LAB tests please let us know if you have not heard back within a few days. You may see your results on MyChart before we have a chance to review them but we will give you a call once they are reviewed by [place]. If we ordered any REFERRALS today, please let us know if you have not heard from their office within the next week.  Let us know through MyChart if you are needing REFILLS, or have your pharmacy send [place] the request. You can also use MyChart to communicate with me or any office staff.  Please try these tips to maintain a healthy lifestyle: It is important that you exercise regularly at least 30 minutes 5 times a week. Think about what you will eat, plan ahead. Choose whole foods, & think  "clean, green, fresh or frozen" over canned, processed or packaged foods which are more sugary, salty, and fatty. 70 to 75% of food eaten should be fresh vegetables and protein. 2-3  meals daily with healthy snacks between meals, but must be whole fruit, protein or vegetables. Aim to eat over a 10 hour period when you are active, for example, 7am to 5pm, and then STOP after your last meal of the day, drinking only water.  Shorter eating windows, 6-8 hours, are showing benefits in heart disease and blood sugar regulation. Drink water every day! Shoot for 64 ounces daily = 8 cups, no other drink is as healthy! Fruit juice is best enjoyed in a healthy way, by EATING the fruit.

## 2022-10-07 ENCOUNTER — Ambulatory Visit (INDEPENDENT_AMBULATORY_CARE_PROVIDER_SITE_OTHER): Payer: Medicaid Other | Admitting: Family

## 2022-10-07 ENCOUNTER — Encounter: Payer: Self-pay | Admitting: Family

## 2022-10-07 VITALS — BP 114/68 | HR 82 | Temp 98.0°F | Ht 66.0 in | Wt 145.0 lb

## 2022-10-07 DIAGNOSIS — Z0000 Encounter for general adult medical examination without abnormal findings: Secondary | ICD-10-CM

## 2022-10-07 DIAGNOSIS — Z13220 Encounter for screening for lipoid disorders: Secondary | ICD-10-CM | POA: Diagnosis not present

## 2022-10-07 DIAGNOSIS — Z0001 Encounter for general adult medical examination with abnormal findings: Secondary | ICD-10-CM | POA: Diagnosis not present

## 2022-10-07 DIAGNOSIS — J301 Allergic rhinitis due to pollen: Secondary | ICD-10-CM | POA: Diagnosis not present

## 2022-10-07 LAB — COMPREHENSIVE METABOLIC PANEL
ALT: 9 U/L (ref 0–35)
AST: 13 U/L (ref 0–37)
Albumin: 4.3 g/dL (ref 3.5–5.2)
Alkaline Phosphatase: 45 U/L (ref 39–117)
BUN: 10 mg/dL (ref 6–23)
CO2: 25 mEq/L (ref 19–32)
Calcium: 9.3 mg/dL (ref 8.4–10.5)
Chloride: 105 mEq/L (ref 96–112)
Creatinine, Ser: 0.7 mg/dL (ref 0.40–1.20)
GFR: 122.27 mL/min (ref >60.00)
Glucose, Bld: 93 mg/dL (ref 70–99)
Potassium: 4 mEq/L (ref 3.5–5.1)
Sodium: 138 mEq/L (ref 135–145)
Total Bilirubin: 0.6 mg/dL (ref 0.2–1.2)
Total Protein: 6.7 g/dL (ref 6.0–8.3)

## 2022-10-07 LAB — LIPID PANEL
Cholesterol: 201 mg/dL — ABNORMAL HIGH (ref 0–200)
HDL: 72.1 mg/dL (ref >39.00)
LDL Cholesterol: 104 mg/dL — ABNORMAL HIGH (ref 0–99)
NonHDL: 128.4
Total CHOL/HDL Ratio: 3
Triglycerides: 123 mg/dL (ref 0.0–149.0)
VLDL: 24.6 mg/dL (ref 0.0–40.0)

## 2022-10-07 LAB — CBC WITH DIFFERENTIAL/PLATELET
Basophils Absolute: 0 10*3/uL (ref 0.0–0.1)
Basophils Relative: 0.6 % (ref 0.0–3.0)
Eosinophils Absolute: 0.3 10*3/uL (ref 0.0–0.7)
Eosinophils Relative: 5.7 % — ABNORMAL HIGH (ref 0.0–5.0)
HCT: 41.1 % (ref 36.0–46.0)
Hemoglobin: 13.8 g/dL (ref 12.0–15.0)
Lymphocytes Relative: 29.2 % (ref 12.0–46.0)
Lymphs Abs: 1.5 10*3/uL (ref 0.7–4.0)
MCHC: 33.6 g/dL (ref 30.0–36.0)
MCV: 89 fl (ref 78.0–100.0)
Monocytes Absolute: 0.3 10*3/uL (ref 0.1–1.0)
Monocytes Relative: 6 % (ref 3.0–12.0)
Neutro Abs: 2.9 10*3/uL (ref 1.4–7.7)
Neutrophils Relative %: 58.5 % (ref 43.0–77.0)
Platelets: 195 10*3/uL (ref 150.0–400.0)
RBC: 4.61 Mil/uL (ref 3.87–5.11)
RDW: 12.8 % (ref 11.5–15.5)
WBC: 5 10*3/uL (ref 4.0–10.5)

## 2022-10-07 LAB — TSH: TSH: 2.34 u[IU]/mL (ref 0.35–5.50)

## 2022-10-07 NOTE — Assessment & Plan Note (Signed)
chronic, intermittent taking zyrtec mostly eye irritation, crusting advised on generic Flonase/Nasacort OTC generic pataday eye drops f/u prn

## 2022-10-07 NOTE — Patient Instructions (Addendum)
It was very nice to see you today!   I will review your lab results via MyChart in a few days.  For your allergies follow the instructions below: Use Nasal saline spray (i.e.,Simply Saline or other generic) or a nasal saline lavage (i.e.,NeilMed, Neti Pot) to flush allergens, general disinfecting &  prior to using medicated nasal sprays. Start Nasacort nasal spray 1 spray each nostril twice a day for 3 days, then reduce to daily use. 2 sprays twice a day is ok for really bad symptoms for 1 week, then reduce to 1 spray twice a day or daily. May add over the counter antihistamines such as Xyzal (levocetirizine), Zyrtec (cetirizine), Claritin (loratadine), or Allegra (fexofenadine) daily as needed. May take twice a day if needed as long as it does not cause drowsiness or too much dryness in nose, mouth or throat. May also use Pataday or generic over the counter eye drops: 1 drop in each eye daily as needed for itchy/watery eyes.     PLEASE NOTE:  If you had any lab tests please let us know if you have not heard back within a few days. You may see your results on MyChart before we have a chance to review them but we will give you a call once they are reviewed by [place]. If we ordered any referrals today, please let us know if you have not heard from their office within the next week.

## 2022-10-07 NOTE — Progress Notes (Signed)
Phone [phone]  Subjective:   Patient is a 22 y.o. female presenting for annual physical.    Chief Complaint  Patient presents with   Annual Exam    Wants to discuss seasonal allergy medication   HPI: Allergies:  mostly in her eyes, some sinus sx, throat mucus, itching, sneezing. Taking Zyrtec which helps some. Used to take Singulair, hx of asthma as well.  See problem oriented charting- ROS- full  review of systems was completed and negative except for allergic rhinitis noted in HPI above.  The following were reviewed and entered/updated in epic: Past Medical History:  Diagnosis Date   Allergic conjunctivitis 11/10/2018   Allergy    seasonal   Asthma    Eczema    Perennial allergic rhinitis 03/21/2015   Reactive airway disease 10/29/2018   Symptom of wheezing 10/29/2018   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   Urticaria    Well child visit 03/21/2015   Patient Active Problem List   Diagnosis Date Noted   Seasonal allergic rhinitis due to pollen 10/07/2022   Moderate persistent asthma 11/10/2018   Atopic dermatitis 11/10/2018   History of food allergy 11/10/2018   Bursitis of intermetatarsal bursa of left foot 09/18/2018   Past Surgical History:  Procedure Laterality Date   WISDOM TOOTH EXTRACTION      Family History  Problem Relation Age of Onset   Arthritis Mother    Asthma Mother    Asthma Maternal Grandmother    Asthma Maternal Grandfather    Cancer Paternal Grandmother    Breast cancer Paternal Grandmother 14   Allergic rhinitis Brother     Medications- reviewed and updated Current Outpatient Medications  Medication Sig Dispense Refill   fluticasone (FLONASE) 50 MCG/ACT nasal spray Place 2 sprays into both nostrils in the morning and at bedtime. (Patient not taking: Reported on 07/22/2022) 16 g 6   levocetirizine (XYZAL) 5 MG tablet Take 1 tablet (5 mg total) by mouth every evening. (Patient not taking: Reported on 07/22/2022) 90 tablet 3   No current  facility-administered medications for this visit.    Allergies-reviewed and updated Allergies  Allergen Reactions   Justicia Adhatoda (Malabar Nut Tree) [Justicia Adhatoda] Hives    Social History   Social History Narrative   Not on file   Objective:  BP 114/68 (BP Location: Right Arm, Patient Position: Sitting, Cuff Size: Normal)   Pulse 82   Temp 98 F (36.7 C)   Ht 5\' 6"  (1.676 m)   Wt 145 lb (65.8 kg)   LMP 09/12/2022 (Approximate)   SpO2 95%   BMI 23.40 kg/m  Physical Exam Vitals and nursing note reviewed.  Constitutional:      Appearance: Normal appearance.  HENT:     Head: Normocephalic.     Right Ear: Tympanic membrane normal.     Left Ear: Tympanic membrane normal.     Nose: Nose normal.     Mouth/Throat:     Mouth: Mucous membranes are moist.  Eyes:     Pupils: Pupils are equal, round, and reactive to light.  Cardiovascular:     Rate and Rhythm: Normal rate and regular rhythm.  Pulmonary:     Effort: Pulmonary effort is normal.     Breath sounds: Normal breath sounds.  Musculoskeletal:        General: Normal range of motion.     Cervical back: Normal range of motion.  Lymphadenopathy:     Cervical: No cervical adenopathy.  Skin:    General: Skin  is warm and dry.  Neurological:     Mental Status: She is alert.  Psychiatric:        Mood and Affect: Mood normal.        Behavior: Behavior normal.     Assessment and Plan   Health Maintenance counseling: 1. Anticipatory guidance: Patient counseled regarding regular dental exams q6 months, eye exams,  avoiding smoking and second hand smoke, limiting alcohol to 1 beverage per day, no illicit drugs.   2. Risk factor reduction:  Advised patient of need for regular exercise and diet rich with fruits and vegetables to reduce risk of heart attack and stroke. Wt Readings from Last 3 Encounters:  10/07/22 145 lb (65.8 kg)  10/01/22 143 lb (64.9 kg)  02/01/20 118 lb 9.6 oz (53.8 kg)   3.  Immunizations/screenings/ancillary studies Immunization History  Administered Date(s) Administered   DTaP 12/17/1999, 02/20/2000, 04/30/2000, 02/09/2001, 01/11/2004   HPV Quadrivalent 08/24/2010, 10/31/2010, 03/04/2011   Hepatitis A 07/02/2006, 07/10/2007   Hepatitis B 03-21-2000, 11/13/1999, 08/12/2000   MMR 11/03/2000, 04/15/2005   Meningococcal Conjugate 02/25/2012   Meningococcal Mcv4o 11/24/2017   Tdap 02/25/2012   Varicella 11/30/2000, 07/10/2007   Health Maintenance Due  Topic Date Due   COVID-19 Vaccine (1) Never done   HIV Screening  Never done   Hepatitis C Screening  Never done   CHLAMYDIA SCREENING  10/30/2018   PAP-Cervical Cytology Screening  Never done   PAP SMEAR-Modifier  Never done   DTaP/Tdap/Td (7 - Td or Tdap) 02/24/2022    4. Cervical cancer screening: not documented 5. Skin cancer screening- advised regular sunscreen use. Denies worrisome, changing, or new skin lesions.  6. Birth control/STD check: none, condoms 7. Smoking associated screening: non- smoker 8. Alcohol screening: rare  Annual physical exam -     Comprehensive metabolic panel -     TSH -     Lipid panel -     CBC with Differential/Platelet  Seasonal allergic rhinitis due to pollen Assessment & Plan: chronic, intermittent taking zyrtec mostly eye irritation, crusting advised on generic Flonase/Nasacort OTC generic pataday eye drops f/u prn   Recommended follow up:  No follow-ups on file. No future appointments.   Lab/Order associations: fasting    Dulce Sellar, NP

## 2022-11-21 ENCOUNTER — Ambulatory Visit (INDEPENDENT_AMBULATORY_CARE_PROVIDER_SITE_OTHER): Payer: Medicaid Other | Admitting: Family

## 2022-11-21 VITALS — BP 108/69 | HR 80 | Temp 97.5°F | Ht 66.0 in | Wt 142.2 lb

## 2022-11-21 DIAGNOSIS — Z01419 Encounter for gynecological examination (general) (routine) without abnormal findings: Secondary | ICD-10-CM

## 2022-11-21 DIAGNOSIS — M79605 Pain in left leg: Secondary | ICD-10-CM

## 2022-11-21 DIAGNOSIS — Z118 Encounter for screening for other infectious and parasitic diseases: Secondary | ICD-10-CM

## 2022-11-21 NOTE — Progress Notes (Signed)
Patient ID: Stacey Moss, female    DOB: 09-21-2000, 22 y.o.   MRN: [id number]  Chief Complaint  Patient presents with   Leg Pain    Pt c/o still having sharp pains in left leg.     HPI: Leg pain:  HX: 10/01/2022 - states she fractured left foot a couple of years ago, has been fine, no surgery done, had to  but reports having sharp pains in the calf and around her knee area. She was driving in the car for a long trip and the pain worsened. She has started wearing compression socks especially when working her job at [employer]. Denies any tingling, numbness, occasional redness, no warmth. *Update - today she feels a shooting pain behind her knee whether sitting or standing, not when lying down, & shooting pain down the sides of her leg from the knee down or ankle up.    Assessment & Plan:  Pain of left lower extremity - pt tried conservative tx recommended last visit, but she is still experiencing shooting pains behind knee cap and down sides of her leg, no swelling noted, no decreased ROM. Sending to Sports med for further work up.  -     Ambulatory referral to Sports Medicine  Encounter for screening examination for chlamydial infection -     Chlamydia/GC NAA, Confirmation  Encounter for gynecological examination -     Ambulatory referral to Gynecology   Subjective:    Outpatient Medications Prior to Visit  Medication Sig Dispense Refill   fluticasone (FLONASE) 50 MCG/ACT nasal spray Place 2 sprays into both nostrils in the morning and at bedtime. (Patient not taking: Reported on 07/22/2022) 16 g 6   levocetirizine (XYZAL) 5 MG tablet Take 1 tablet (5 mg total) by mouth every evening. (Patient not taking: Reported on 07/22/2022) 90 tablet 3   No facility-administered medications prior to visit.   Past Medical History:  Diagnosis Date   Allergic conjunctivitis 11/10/2018   Allergy    seasonal   Asthma    Bursitis of intermetatarsal bursa of left foot 09/18/2018   Eczema     History of food allergy 11/10/2018   Perennial allergic rhinitis 03/21/2015   Reactive airway disease 10/29/2018   Symptom of wheezing 10/29/2018   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   Urticaria    Well child visit 03/21/2015   Past Surgical History:  Procedure Laterality Date   WISDOM TOOTH EXTRACTION     Allergies  Allergen Reactions   Justicia Adhatoda (Malabar Nut Tree) [Justicia Adhatoda] Hives      Objective:    Physical Exam Vitals and nursing note reviewed.  Constitutional:      Appearance: Normal appearance.  Cardiovascular:     Rate and Rhythm: Normal rate and regular rhythm.  Pulmonary:     Effort: Pulmonary effort is normal.     Breath sounds: Normal breath sounds.  Musculoskeletal:        General: Normal range of motion.     Left lower leg: No swelling, tenderness or bony tenderness.  Skin:    General: Skin is warm and dry.  Neurological:     Mental Status: She is alert.  Psychiatric:        Mood and Affect: Mood normal.        Behavior: Behavior normal.    BP 108/69   Pulse 80   Temp (!) 97.5 F (36.4 C)   Ht 5\' 6"  (1.676 m)   Wt 142 lb 3.2 oz (64.5  kg)   SpO2 100%   BMI 22.95 kg/m  Wt Readings from Last 3 Encounters:  11/21/22 142 lb 3.2 oz (64.5 kg)  10/07/22 145 lb (65.8 kg)  10/01/22 143 lb (64.9 kg)      Dulce Sellar, NP

## 2022-11-25 LAB — CHLAMYDIA/GC NAA, CONFIRMATION
Chlamydia trachomatis, NAA: NEGATIVE
Neisseria gonorrhoeae, NAA: NEGATIVE

## 2022-11-29 ENCOUNTER — Ambulatory Visit (INDEPENDENT_AMBULATORY_CARE_PROVIDER_SITE_OTHER): Payer: Medicaid Other | Admitting: Sports Medicine

## 2022-11-29 VITALS — BP 104/70 | Ht 66.0 in | Wt 143.0 lb

## 2022-11-29 DIAGNOSIS — G8929 Other chronic pain: Secondary | ICD-10-CM | POA: Insufficient documentation

## 2022-11-29 DIAGNOSIS — M25562 Pain in left knee: Secondary | ICD-10-CM | POA: Diagnosis present

## 2022-11-29 NOTE — Patient Instructions (Signed)
I believe your knee pain is likely secondary to some irritation of the area behind your kneecap.  I would like you to work on some physical therapy exercises to strengthen your quad.  We will follow-up with you in 5 to 6 weeks.

## 2022-11-29 NOTE — Progress Notes (Signed)
New Patient Office Visit  Subjective   Patient ID: Alleta Barilla, female    DOB: May 06, 2001  Age: 22 y.o. MRN: [id number]  L knee pain.  Ms. Milliren is here today with chief complaint of left knee pain, referral by her primary care provider.  She states she began having this pain back in February.  She saw her primary care provider back in April.  She increased her electrolytes at that time as her primary care thought it may be some cramping.  She has not noticed any difference.  She describes her pain as sharp mainly on the inside and front of her knee.  She periodically notices some swelling mainly in the anterior side of her knee.  Her pain is worse with prolonged positions including seated and standing.  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  She has not noticed any improvement in her pain.  She has tried some occasional ice and elevation.  She does notice a difference in the swelling in the morning after she has done some elevation.  She denies any acute injury, locking or buckling of her knee.   ROS as listed above in HPI    Objective:     BP 104/70   Ht 5\' 6"  (1.676 m)   Wt 143 lb (64.9 kg)   BMI 23.08 kg/m   Physical Exam Vitals reviewed.  Constitutional:      General: She is not in acute distress.    Appearance: Normal appearance. She is not ill-appearing, toxic-appearing or diaphoretic.  Neurological:     Mental Status: She is alert.  Psychiatric:        Mood and Affect: Mood normal.        Behavior: Behavior normal.        Thought Content: Thought content normal.        Judgment: Judgment normal.   Left knee: No obvious deformity or asymmetry.  No ecchymosis or effusion appreciated today.  She does have some tenderness to palpation at the inferior pole of her patella and posterior medial joint line.  No tenderness to palpation of the lateral joint line.  She has full range of motion from 0 to 130 degrees.   Strength 5/5 resisted flexion and extension.  Stable to varus and valgus stress.  Negative Lachman's.  Negative McMurray's.  She does get small amount of pain medial aspect of her knee during McMurray's however no obvious positive test.  Positive patellar grind.  She has good quad tone.  Normal gait.     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  Provided her with a prescription for physical therapy to work on some strengthening activities of her quad.  Would recommend holding off on 1 legged squats at the gym or deep squats past 90 degrees at this time.  After couple weeks of therapy she should be able to progress back to the squat activities.  Recommend she use some anti-inflammatories, Tylenol 600 mg twice a day for the next week.  She can also use ice after her therapy sessions.  Will follow-up with her in 5 to 6 weeks.  Should she have no improvement would recommend ultrasound evaluation to begin. She has had a couple  injuries to her left ankle as well.  With her knee bothering her ankle has begun to ache little bit as well.  She is provided with some home exercises for her ankle and recommend an ASO brace for the next 2 weeks.  We can also follow-up with her on that in 5 to 6 weeks.      Relevant Orders   Ambulatory referral to Physical Therapy    Return in about 6 weeks (around 01/10/2023).    Claudie Leach, DO  I was the preceptor for this visit and available for immediate consultation Marsa Aris, DO

## 2022-11-29 NOTE — Assessment & Plan Note (Signed)
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  Provided her with a prescription for physical therapy to work on some strengthening activities of her quad.  Would recommend holding off on 1 legged squats at the gym or deep squats past 90 degrees at this time.  After couple weeks of therapy she should be able to progress back to the squat activities.  Recommend she use some anti-inflammatories, Tylenol 600 mg twice a day for the next week.  She can also use ice after her therapy sessions.  Will follow-up with her in 5 to 6 weeks.  Should she have no improvement would recommend ultrasound evaluation to begin. She has had a couple injuries to her left ankle as well.  With her knee bothering her ankle has begun to ache little bit as well.  She is provided with some home exercises for her ankle and recommend an ASO brace for the next 2 weeks.  We can also follow-up with her on that in 5 to 6 weeks.

## 2022-12-07 ENCOUNTER — Ambulatory Visit
Admission: RE | Admit: 2022-12-07 | Discharge: 2022-12-07 | Disposition: A | Discharge status: Home / Self Care | Payer: Medicaid Other | Source: Ambulatory Visit | Attending: Urgent Care | Admitting: Urgent Care

## 2022-12-07 ENCOUNTER — Ambulatory Visit: Payer: Medicaid Other

## 2022-12-07 ENCOUNTER — Ambulatory Visit (INDEPENDENT_AMBULATORY_CARE_PROVIDER_SITE_OTHER): Payer: Medicaid Other

## 2022-12-07 VITALS — BP 110/74 | HR 86 | Temp 98.6°F | Resp 18

## 2022-12-07 DIAGNOSIS — M7052 Other bursitis of knee, left knee: Secondary | ICD-10-CM | POA: Diagnosis not present

## 2022-12-07 DIAGNOSIS — M25561 Pain in right knee: Secondary | ICD-10-CM | POA: Diagnosis not present

## 2022-12-07 DIAGNOSIS — M25562 Pain in left knee: Secondary | ICD-10-CM

## 2022-12-07 MED ORDER — NAPROXEN 500 MG PO TABS: 500.0000 mg | ORAL_TABLET | Freq: Two times a day (BID) | ORAL | 0 refills | Status: DC

## 2022-12-07 NOTE — ED Provider Notes (Signed)
[facility]  Note:  This document was prepared using Conservation officer, historic buildings and may include unintentional dictation errors.  MRN: [id number] DOB: [birth year]  Subjective:   Stacey Moss is a 22 y.o. female presenting for persistent left knee pain, intermittent cramping type sore sensation of the distal thigh and calf.  Patient has worked with her PCP and [facility] for this.  Imaging has not been done.  Has concerns about having a blood clot.  No history of clotting disorder.  No calf swelling, calf redness or warmth.  Patient is very active with her work, does a lot of standing, walking, lifting.  Works as a Production assistant, radio and also Dietitian.  She has also had trouble with her foot and ankle of the same leg and uses an ankle brace as this is being managed by her orthopedist.  Denies any particular fall, trauma, bruising of the knee.  No current facility-administered medications for this encounter. No current outpatient medications on file.   Allergies  Allergen Reactions   Justicia Adhatoda (Malabar Nut Tree) [Justicia Adhatoda] Hives   Other     TREE NUTS    Past Medical History:  Diagnosis Date   Allergic conjunctivitis 11/10/2018   Allergy    seasonal   Asthma    Bursitis of intermetatarsal bursa of left foot 09/18/2018   Eczema    History of food allergy 11/10/2018   Perennial allergic rhinitis 03/21/2015   Reactive airway disease 10/29/2018   Symptom of wheezing 10/29/2018   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   Urticaria    Well child visit 03/21/2015     Past Surgical History:  Procedure Laterality Date   WISDOM TOOTH EXTRACTION      Family History  Problem Relation Age of Onset   Arthritis Mother    Asthma Mother    Asthma Maternal Grandmother    Asthma Maternal Grandfather    Cancer Paternal Grandmother    Breast cancer Paternal Grandmother 7   Allergic rhinitis Brother     Social History   Tobacco Use   Smoking status:  Never   Smokeless tobacco: Never  Vaping Use   Vaping Use: Never used  Substance Use Topics   Alcohol use: Not Currently    Comment: occ   Drug use: No    ROS   Objective:   Vitals: BP 110/74 (BP Location: Left Arm)   Pulse 86   Temp 98.6 F (37 C) (Oral)   Resp 18   LMP 11/12/2022   SpO2 96%   Physical Exam Constitutional:      General: She is not in acute distress.    Appearance: Normal appearance. She is well-developed. She is not ill-appearing, toxic-appearing or diaphoretic.  HENT:     Head: Normocephalic and atraumatic.     Nose: Nose normal.     Mouth/Throat:     Mouth: Mucous membranes are moist.  Eyes:     General: No scleral icterus.       Right eye: No discharge.        Left eye: No discharge.     Extraocular Movements: Extraocular movements intact.  Cardiovascular:     Rate and Rhythm: Normal rate.  Pulmonary:     Effort: Pulmonary effort is normal.  Musculoskeletal:     Left knee: Crepitus present. No swelling, deformity, effusion, erythema, ecchymosis, lacerations or bony tenderness. Normal range of motion. Tenderness (mild) present over the patellar tendon. No medial joint line or lateral  joint line tenderness. Normal alignment and normal patellar mobility.  Skin:    General: Skin is warm and dry.  Neurological:     General: No focal deficit present.     Mental Status: She is alert and oriented to person, place, and time.  Psychiatric:        Mood and Affect: Mood normal.        Behavior: Behavior normal.    DG Knee Complete 4 Views Left  Result Date: 12/07/2022 CLINICAL DATA:  Left knee pain.  No injury. EXAM: LEFT KNEE - COMPLETE 4+ VIEW COMPARISON:  None Available. FINDINGS: No evidence of fracture, dislocation, or joint effusion. No evidence of arthropathy or other focal bone abnormality. Soft tissues are unremarkable. IMPRESSION: Negative. Electronically Signed   By: Amie Portland M.D.   On: 12/07/2022 10:15    I applied a 3 inch Ace wrap  to the left knee.  Assessment and Plan :   PDMP not reviewed this encounter.  1. Acute pain of left knee   2. Bursitis of left knee, unspecified bursa    Suspect an inflammatory process secondary to overuse and/or secondary effects from chronic issues with her ankle and foot of the same leg.  Recommended RICE method, naproxen for pain and inflammation and very close follow-up with her orthopedist.  Counseled patient on potential for adverse effects with medications prescribed/recommended today, ER and return-to-clinic precautions discussed, patient verbalized understanding.    Wallis Bamberg, PA-C 12/07/22 1447

## 2022-12-07 NOTE — ED Triage Notes (Addendum)
Pt c/o LLE pain, cramping and tingling-started 07/2022-denies injury-states she has been seen multiple times for c/o-pt concerned she had a blood clot-pt is wearing velcro ankle brace per ortho recommendation-NAD-steady gait

## 2022-12-12 NOTE — Therapy (Signed)
OUTPATIENT PHYSICAL THERAPY EVALUATION   Patient Name: Stacey Moss MRN: [id number] DOB:11/28/2000, 22 y.o., female Today's Date: 12/13/2022   END OF SESSION:  PT End of Session - 12/13/22 0808     Visit Number 1    Number of Visits 6    Date for PT Re-Evaluation 01/24/23    Authorization Type [facility]    Authorization - Number of Visits 27    PT Start Time 0808    PT Stop Time 0845    PT Time Calculation (min) 37 min    Activity Tolerance Patient tolerated treatment well    Behavior During Therapy [facility] for tasks assessed/performed             Past Medical History:  Diagnosis Date   Allergic conjunctivitis 11/10/2018   Allergy    seasonal   Asthma    Bursitis of intermetatarsal bursa of left foot 09/18/2018   Eczema    History of food allergy 11/10/2018   Perennial allergic rhinitis 03/21/2015   Reactive airway disease 10/29/2018   Symptom of wheezing 10/29/2018   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   Urticaria    Well child visit 03/21/2015   Past Surgical History:  Procedure Laterality Date   WISDOM TOOTH EXTRACTION     Patient Active Problem List   Diagnosis Date Noted   Chronic pain of left knee 11/29/2022   Seasonal allergic rhinitis due to pollen 10/07/2022   Moderate persistent asthma 11/10/2018   Atopic dermatitis 11/10/2018    PCP: Dulce Sellar, NP  REFERRING PROVIDER: Ralene Cork, DO  REFERRING DIAG: Chronic pain of left knee  THERAPY DIAG:  Chronic pain of left knee  Muscle weakness (generalized)  Rationale for Evaluation and Treatment: Rehabilitation  ONSET DATE: February 2024   SUBJECTIVE:  SUBJECTIVE STATEMENT: Patient reports back in February she was having some cramping around her left knee. Then her knee got a lot worse around July 4th. She has been wearing a wrap around her knee and an ASO ankle brace around the ankle. She denies any mechanism of injury, pain just gradually came on. If she sits normal she can have pain, but if she  is lying down it doesn't hurt. Walking or standing will hurt the knee. She will get pain on the front of the knee and it shoots down, then a dull pain on the inside of her knee. She was getting a pain in the back of her lower leg but not anymore. She did have a previous left ankle injury in 2019, reports stress fractures. She was instructed to avoid squatting and other activities in the gym.  PERTINENT HISTORY: See PMH above  PAIN:  Are you having pain? Yes:  NPRS scale: 5/10 Pain location: Left knee Pain description: Dull, aching, stabbing occasionally  Aggravating factors: Sitting, standing, walking Relieving factors: Elevating the leg and using heat  PRECAUTIONS: None  RED FLAGS: None   WEIGHT BEARING RESTRICTIONS: No  FALLS:  Has patient fallen in last 6 months? No  OCCUPATION: Works at [employer]  PLOF: Independent  PATIENT GOALS: Pain relief and return to prior level of function   OBJECTIVE:  PATIENT SURVEYS:  FOTO 44% functional status  COGNITION: Overall cognitive status: Within functional limits for tasks assessed     SENSATION: WFL  EDEMA:  Patient does demonstrate left ankle edema compared to right  MUSCLE LENGTH: Patient demonstrates limitations of left hamstring, ITB, and calf   POSTURE:   WFL  PALPATION: Tender to palpation lateral  hamstring, calf, and posterior knee, midline quad tenderness, tenderness over lateral femoral epicondyle and ITB  Patellar mobility WFL but with pain medial glide  LOWER EXTREMITY ROM:  Active ROM Right eval Left eval  Hip flexion    Hip extension    Hip abduction    Hip adduction    Hip internal rotation    Hip external rotation    Knee flexion 140 140  Knee extension 5 hyper 5 hyper  Ankle dorsiflexion 12 8  Ankle plantarflexion    Ankle inversion    Ankle eversion     (Blank rows = not tested)  Patient reports posterior knee pain with left knee hyperextension  LOWER EXTREMITY MMT:  MMT  Right eval Left eval  Hip flexion    Hip extension 4 4-  Hip abduction 4 4-  Hip adduction    Hip internal rotation    Hip external rotation    Knee flexion 5 4+  Knee extension 5 4+  Ankle dorsiflexion    Ankle plantarflexion    Ankle inversion    Ankle eversion     (Blank rows = not tested)  LOWER EXTREMITY SPECIAL TESTS:  Not assessed  FUNCTIONAL TESTS:  Squat: weight shift to the right, decreased depth, excessive forward knee translation, dynamic knee valgus  GAIT: Assistive device utilized: None Level of assistance: Complete Independence Comments: Antalgic on left   TODAY'S TREATMENT:         [facility] Adult PT Treatment:                                                DATE: 12/13/2022 Therapeutic Exercise: Supine hamstring and ITB stretch with strap x 30 sec each Longsitting calf stretch with strap x 30 sec SMFR using tennis ball for left calf and hamstring SLR x 10 Side clamshell with red x 10  PATIENT EDUCATION:  Education details: Exam findings, POC, HEP Person educated: Patient Education method: Explanation, Demonstration, Tactile cues, Verbal cues, and Handouts Education comprehension: verbalized understanding, returned demonstration, verbal cues required, tactile cues required, and needs further education  HOME EXERCISE PROGRAM: Access Code: [email or web address]    ASSESSMENT: CLINICAL IMPRESSION: Patient is a 22 y.o. female who was seen today for physical therapy evaluation and treatment for chronic left knee pain. Unclear etiology of pain as she reports pain diffusely around left knee. She does exhibit hamstring, ITB, and calf tightness with posterior knee pain with stretching and tenderness to these regions, gross left hip and knee strength deficits with difficulty and pain with active knee hyperextension and maintaining SLR, deviations with squat technique. She has history of left ankle injury that likely led to movement compensations. She has been wearing left knee  wrap and ankle brace but instructed she could begin to wean from these and also she can gradually progress back to squatting and exercise in the gym as tolerated.   OBJECTIVE IMPAIRMENTS: Abnormal gait, decreased activity tolerance, decreased ROM, decreased strength, impaired flexibility, and pain.   ACTIVITY LIMITATIONS: lifting, bending, sitting, standing, stairs, and locomotion level  PARTICIPATION LIMITATIONS: meal prep, cleaning, driving, shopping, community activity, and occupation  PERSONAL FACTORS: Past/current experiences and Time since onset of injury/illness/exacerbation are also affecting patient's functional outcome.   REHAB POTENTIAL: Good  CLINICAL DECISION MAKING: Stable/uncomplicated  EVALUATION COMPLEXITY: Low   GOALS: Goals reviewed with patient? Yes  SHORT TERM  GOALS: Target date: 01/03/2023  Patient will be I with initial HEP in order to progress with therapy. Baseline: HEP provided at eval Goal status: INITIAL  2.  Patient will report left knee pain </= 2/10 in order to reduce functional limitations Baseline: 5/10 Goal status: INITIAL  3.  Patient will demonstrate proper squat technique to return to gym exercise and strength left knee Baseline: patient exhibits deviations with squat technique Goal status: INITIAL  LONG TERM GOALS: Target date: 01/24/2023  Patient will be I with final HEP to maintain progress from PT. Baseline: HEP provided at eval Goal status: INITIAL  2.  Patient will report >/= 69% status on FOTO to indicate improved functional ability. Baseline: 44% functional status Goal status: INITIAL  3.  Patient will demonstrate left hip strength >/= 4/5 MMT and knee strength 5/5 MMT to improve standing and walking tolerance Baseline: see limitations above Goal status: INITIAL  4.  Patient will report no limitations with standing for work related tasks in order to reduce functional limitations Baseline: patient reports limitations at  work Goal status: INITIAL   PLAN: PT FREQUENCY: 1x/week  PT DURATION: 6 weeks  PLANNED INTERVENTIONS: Therapeutic exercises, Therapeutic activity, Neuromuscular re-education, Balance training, Gait training, Patient/Family education, Self Care, Joint mobilization, Aquatic Therapy, Dry Needling, Electrical stimulation, Cryotherapy, Moist heat, Taping, Ionotophoresis 4mg /ml Dexamethasone, Manual therapy, and Re-evaluation  PLAN FOR NEXT SESSION: Review HEP and progress PRN, manual/TPDN for left hamstring/calf/quad, progress stretching and strengthening for the left hip and knee, squat mechanics   Rosana Hoes, PT, DPT, LAT, ATC 12/13/22  12:01 PM Phone: [phone] Fax: [phone]

## 2022-12-13 ENCOUNTER — Other Ambulatory Visit: Payer: Self-pay

## 2022-12-13 ENCOUNTER — Encounter: Payer: Self-pay | Admitting: Physical Therapy

## 2022-12-13 ENCOUNTER — Ambulatory Visit: Payer: Medicaid Other | Attending: Sports Medicine | Admitting: Physical Therapy

## 2022-12-13 DIAGNOSIS — G8929 Other chronic pain: Secondary | ICD-10-CM | POA: Insufficient documentation

## 2022-12-13 DIAGNOSIS — M6281 Muscle weakness (generalized): Secondary | ICD-10-CM | POA: Insufficient documentation

## 2022-12-13 DIAGNOSIS — M25562 Pain in left knee: Secondary | ICD-10-CM | POA: Diagnosis present

## 2022-12-13 NOTE — Patient Instructions (Signed)
Access Code: [email or web address] URL: [email or web address] Date: 12/13/2022 Prepared by: Rosana Hoes  Exercises - Supine Hamstring Stretch with Strap  - 1-2 x daily - 3 reps - 30 seconds hold - Supine ITB Stretch with Strap  - 1-2 x daily - 3 reps - 30 seconds hold - Long Sitting Calf Stretch with Strap  - 1-2 x daily - 3 reps - 30 seconds hold - Calf Mobilization with Small Ball  - 1-2 x daily - 7 x weekly - 3 sets - 10 reps - Active Straight Leg Raise with Quad Set  - 1 x daily - 3 sets - 10 reps - Clam with Resistance  - 1 x daily - 3 sets - 10 reps

## 2022-12-23 ENCOUNTER — Other Ambulatory Visit: Payer: Self-pay

## 2022-12-23 ENCOUNTER — Ambulatory Visit: Payer: Medicaid Other | Admitting: Family Medicine

## 2022-12-23 ENCOUNTER — Encounter: Payer: Self-pay | Admitting: Family Medicine

## 2022-12-23 VITALS — BP 98/58 | HR 83 | Ht 66.0 in | Wt 143.0 lb

## 2022-12-23 DIAGNOSIS — G8929 Other chronic pain: Secondary | ICD-10-CM

## 2022-12-23 DIAGNOSIS — M25562 Pain in left knee: Secondary | ICD-10-CM | POA: Diagnosis not present

## 2022-12-23 MED ORDER — MELOXICAM 15 MG PO TABS: 15.0000 mg | ORAL_TABLET | Freq: Every day | ORAL | 2 refills | Status: DC

## 2022-12-23 NOTE — Patient Instructions (Addendum)
Take meloxicam 15mg  daily with food for pain and inflammation - take regularly for 7 days then as needed. Don't pick up the medicine the urgent care sent in. Continue physical therapy and do home exercises on days you don't go to therapy. Let us know how you're doing on or after August 9.  If still not improving would recommend an MRI. If you're doing well, follow up with [place] in 6 weeks.

## 2022-12-24 ENCOUNTER — Encounter: Payer: Self-pay | Admitting: Family Medicine

## 2022-12-24 NOTE — Progress Notes (Signed)
PCP: Dulce Sellar, NP  Subjective:   HPI: Patient is a 22 y.o. female here for left knee pain.  6/28: Stacey Moss is here today with chief complaint of left knee pain, referral by her primary care provider.  She states she began having this pain back in February.  She saw her primary care provider back in April.  She increased her electrolytes at that time as her primary care thought it may be some cramping.  She has not noticed any difference.  She describes her pain as sharp mainly on the inside and front of her knee.  She periodically notices some swelling mainly in the anterior side of her knee.  Her pain is worse with prolonged positions including seated and standing.  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  She has not noticed any improvement in her pain.  She has tried some occasional ice and elevation.  She does notice a difference in the swelling in the morning after she has done some elevation.  She denies any acute injury, locking or buckling of her knee.  7/22: Patient reports she continues to struggle with pain in her left knee. Has been doing physical therapy and home exercises. One episode of giving out of her left knee. Pain shooting up leg. Bothers at times even when sitting in bed. Associated swelling. Worse when on her feet a lot though.  Past Medical History:  Diagnosis Date   Allergic conjunctivitis 11/10/2018   Allergy    seasonal   Asthma    Bursitis of intermetatarsal bursa of left foot 09/18/2018   Eczema    History of food allergy 11/10/2018   Perennial allergic rhinitis 03/21/2015   Reactive airway disease 10/29/2018   Symptom of wheezing 10/29/2018   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   Urticaria    Well child visit 03/21/2015    No current outpatient medications on file prior to visit.   No current facility-administered medications on file prior to visit.    Past Surgical History:   Procedure Laterality Date   WISDOM TOOTH EXTRACTION      Allergies  Allergen Reactions   Justicia Adhatoda (Malabar Nut Tree) [Justicia Adhatoda] Hives   Other     TREE NUTS    BP (!) 98/58 (BP Location: Left Arm, Patient Position: Sitting)   Pulse 83   Ht 5\' 6"  (1.676 m)   Wt 143 lb (64.9 kg)   LMP 11/12/2022   SpO2 97%   BMI 23.08 kg/m       No data to display              No data to display              Objective:  Physical Exam:  Gen: NAD, comfortable in exam room  Left knee: No gross deformity, ecchymoses, swelling. TTP post patellar facets, medial joint line. FROM with normal strength. Negative ant/post drawers. Negative valgus/varus testing. Negative lachman.  Mild positive mcmurrays, negative apleys. Mild positive thessalys. NV intact distally.   Assessment & Plan:  1. Left knee pain - present since February. One episode of instability.  She's been doing physical therapy and home exercises since last visit on 6/28.  Continue with these.  Meloxicam as well.  Icing if needed.  Let us know how she's doing when 6 weeks out from initial visit - if not improving would go ahead with MRI to assess for  meniscus tear, possible ortho referral depending on those results.

## 2022-12-27 ENCOUNTER — Other Ambulatory Visit: Payer: Self-pay

## 2022-12-27 ENCOUNTER — Encounter: Payer: Self-pay | Admitting: Physical Therapy

## 2022-12-27 ENCOUNTER — Ambulatory Visit: Payer: Medicaid Other | Admitting: Physical Therapy

## 2022-12-27 DIAGNOSIS — M25562 Pain in left knee: Secondary | ICD-10-CM | POA: Diagnosis not present

## 2022-12-27 DIAGNOSIS — G8929 Other chronic pain: Secondary | ICD-10-CM

## 2022-12-27 DIAGNOSIS — M6281 Muscle weakness (generalized): Secondary | ICD-10-CM

## 2022-12-27 NOTE — Therapy (Signed)
OUTPATIENT PHYSICAL THERAPY TREATMENT   Patient Name: Stacey Moss MRN: [id number] DOB:01-19-2001, 22 y.o., female Today's Date: 12/27/2022   END OF SESSION:  PT End of Session - 12/27/22 0856     Visit Number 2    Number of Visits 6    Date for PT Re-Evaluation 01/24/23    Authorization Type [facility]    Authorization - Number of Visits 27    PT Start Time 0856    PT Stop Time 0930    PT Time Calculation (min) 34 min    Activity Tolerance Patient tolerated treatment well    Behavior During Therapy [facility] for tasks assessed/performed              Past Medical History:  Diagnosis Date   Allergic conjunctivitis 11/10/2018   Allergy    seasonal   Asthma    Bursitis of intermetatarsal bursa of left foot 09/18/2018   Eczema    History of food allergy 11/10/2018   Perennial allergic rhinitis 03/21/2015   Reactive airway disease 10/29/2018   Symptom of wheezing 10/29/2018   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   Urticaria    Well child visit 03/21/2015   Past Surgical History:  Procedure Laterality Date   WISDOM TOOTH EXTRACTION     Patient Active Problem List   Diagnosis Date Noted   Chronic pain of left knee 11/29/2022   Seasonal allergic rhinitis due to pollen 10/07/2022   Moderate persistent asthma 11/10/2018   Atopic dermatitis 11/10/2018    PCP: Dulce Sellar, NP  REFERRING PROVIDER: Ralene Cork, DO  REFERRING DIAG: Chronic pain of left knee  THERAPY DIAG:  Chronic pain of left knee  Muscle weakness (generalized)  Rationale for Evaluation and Treatment: Rehabilitation  ONSET DATE: February 2024   SUBJECTIVE:  SUBJECTIVE STATEMENT: Patient reports she saw the doctor and they did not see anything with an ultrasound. She states her knee is good today, but it is always hurting in the morning.  PAIN:  Are you having pain? Yes:  NPRS scale: 1/10 Pain location: Left knee Pain description: Dull, aching, stabbing occasionally  Aggravating factors: Sitting,  standing, walking Relieving factors: Elevating the leg and using heat  PERTINENT HISTORY: See PMH above  PRECAUTIONS: None  PATIENT GOALS: Pain relief and return to prior level of function   OBJECTIVE:  PATIENT SURVEYS:  FOTO 44% functional status  EDEMA:  Patient does demonstrate left ankle edema compared to right  MUSCLE LENGTH: Patient demonstrates limitations of left hamstring, ITB, and calf   PALPATION: Tender to palpation lateral hamstring, calf, and posterior knee, midline quad tenderness, tenderness over lateral femoral epicondyle and ITB  Patellar mobility WFL but with pain medial glide  LOWER EXTREMITY ROM:  Active ROM Right eval Left eval  Hip flexion    Hip extension    Hip abduction    Hip adduction    Hip internal rotation    Hip external rotation    Knee flexion 140 140  Knee extension 5 hyper 5 hyper  Ankle dorsiflexion 12 8  Ankle plantarflexion    Ankle inversion    Ankle eversion     (Blank rows = not tested)  Patient reports posterior knee pain with left knee hyperextension  LOWER EXTREMITY MMT:  MMT Right eval Left eval  Hip flexion    Hip extension 4 4-  Hip abduction 4 4-  Hip adduction    Hip internal rotation    Hip external rotation    Knee flexion  5 4+  Knee extension 5 4+  Ankle dorsiflexion    Ankle plantarflexion    Ankle inversion    Ankle eversion     (Blank rows = not tested)  FUNCTIONAL TESTS:  Squat: weight shift to the right, decreased depth, excessive forward knee translation, dynamic knee valgus  GAIT: Assistive device utilized: None Level of assistance: Complete Independence Comments: Antalgic on left   TODAY'S TREATMENT:         [facility] Adult PT Treatment:                                                DATE: 12/27/2022 Therapeutic Exercise: Recumbent bike L3 x 4 min while taking subjective Prone quad stretch 2 x 30 sec Slant board calf stretch 2 x 30 sec Supine hamstring and ITB stretch with strap 2 x  30 sec each Bridge 10 x 10 sec Quad set with heel prop 10 x 5 sec on left SLR x 10 on left Side clamshell with green 2 x 10 on left Manual: Skilled palpation and monitoring of muscle tension while performing TPDN STM left quad, calf, and hamstring Trigger Point Dry Needling Treatment: Pre-treatment instruction: Patient instructed on dry needling rationale, procedures, and possible side effects including pain during treatment (achy,cramping feeling), bruising, drop of blood, lightheadedness, nausea, sweating. Patient Consent Given: Yes Education handout provided: No Muscles treated: Left rectus femoris, vastus lateralis, lateral gastroc  Needle size and number: .30x37mm x 5 Electrical stimulation performed: No Parameters: N/A Treatment response/outcome: Twitch response elicited and Palpable decrease in muscle tension Post-treatment instructions: Patient instructed to expect possible mild to moderate muscle soreness later today and/or tomorrow. Patient instructed in methods to reduce muscle soreness and to continue prescribed HEP. If patient was dry needled over the lung field, patient was instructed on signs and symptoms of pneumothorax and, however unlikely, to see immediate medical attention should they occur. Patient was also educated on signs and symptoms of infection and to seek medical attention should they occur. Patient verbalized understanding of these instructions and education.   [facility] Adult PT Treatment:                                                DATE: 12/13/2022 Therapeutic Exercise: Supine hamstring and ITB stretch with strap x 30 sec each Longsitting calf stretch with strap x 30 sec SMFR using tennis ball for left calf and hamstring SLR x 10 Side clamshell with red x 10  PATIENT EDUCATION:  Education details: HEP update Person educated: Patient Education method: Explanation, Demonstration, Tactile cues, Verbal cues, and Handouts Education comprehension: verbalized  understanding, returned demonstration, verbal cues required, tactile cues required, and needs further education  HOME EXERCISE PROGRAM: Access Code: [email or web address]    ASSESSMENT: CLINICAL IMPRESSION: Patient tolerated therapy well with no adverse effects. Performed TPDN for the left quad and calf this visit with good therapeutic benefit. Therapy focused on progress her left leg flexibility and strength with good tolerance. She was able to achieve knee hyperextension with better tolerance and progressed hip strengthening. She reports feeling most of the discomfort along the hamstring and calf region, but her knee will get sore with extended periods of standing. Updated her HEP to progress strengthening  for home. Patient would benefit from continued skilled PT to progress her mobility and strength in order to reduce pain and maximize functional ability.   OBJECTIVE IMPAIRMENTS: Abnormal gait, decreased activity tolerance, decreased ROM, decreased strength, impaired flexibility, and pain.   ACTIVITY LIMITATIONS: lifting, bending, sitting, standing, stairs, and locomotion level  PARTICIPATION LIMITATIONS: meal prep, cleaning, driving, shopping, community activity, and occupation  PERSONAL FACTORS: Past/current experiences and Time since onset of injury/illness/exacerbation are also affecting patient's functional outcome.    GOALS: Goals reviewed with patient? Yes  SHORT TERM GOALS: Target date: 01/03/2023  Patient will be I with initial HEP in order to progress with therapy. Baseline: HEP provided at eval Goal status: INITIAL  2.  Patient will report left knee pain </= 2/10 in order to reduce functional limitations Baseline: 5/10 Goal status: INITIAL  3.  Patient will demonstrate proper squat technique to return to gym exercise and strength left knee Baseline: patient exhibits deviations with squat technique Goal status: INITIAL  LONG TERM GOALS: Target date: 01/24/2023  Patient will be I  with final HEP to maintain progress from PT. Baseline: HEP provided at eval Goal status: INITIAL  2.  Patient will report >/= 69% status on FOTO to indicate improved functional ability. Baseline: 44% functional status Goal status: INITIAL  3.  Patient will demonstrate left hip strength >/= 4/5 MMT and knee strength 5/5 MMT to improve standing and walking tolerance Baseline: see limitations above Goal status: INITIAL  4.  Patient will report no limitations with standing for work related tasks in order to reduce functional limitations Baseline: patient reports limitations at work Goal status: INITIAL   PLAN: PT FREQUENCY: 1x/week  PT DURATION: 6 weeks  PLANNED INTERVENTIONS: Therapeutic exercises, Therapeutic activity, Neuromuscular re-education, Balance training, Gait training, Patient/Family education, Self Care, Joint mobilization, Aquatic Therapy, Dry Needling, Electrical stimulation, Cryotherapy, Moist heat, Taping, Ionotophoresis 4mg /ml Dexamethasone, Manual therapy, and Re-evaluation  PLAN FOR NEXT SESSION: Review HEP and progress PRN, manual/TPDN for left hamstring/calf/quad, progress stretching and strengthening for the left hip and knee, squat mechanics   Rosana Hoes, PT, DPT, LAT, ATC 12/27/22  10:59 AM Phone: [phone] Fax: [phone]

## 2022-12-27 NOTE — Patient Instructions (Signed)
Access Code: [email or web address] URL: [email or web address] Date: 12/27/2022 Prepared by: Rosana Hoes  Exercises - Supine Hamstring Stretch with Strap  - 1-2 x daily - 3 reps - 30 seconds hold - Supine ITB Stretch with Strap  - 1-2 x daily - 3 reps - 30 seconds hold - Long Sitting Calf Stretch with Strap  - 1-2 x daily - 3 reps - 30 seconds hold - Calf Mobilization with Small Ball  - 1-2 x daily - 7 x weekly - 3 sets - 10 reps - Active Straight Leg Raise with Quad Set  - 1 x daily - 3 sets - 10 reps - Clam with Resistance  - 1 x daily - 3 sets - 10 reps - Bridge  - 1 x daily - 3 sets - 10 reps - 5 seconds hold

## 2022-12-29 ENCOUNTER — Ambulatory Visit (HOSPITAL_COMMUNITY): Payer: Medicaid Other

## 2022-12-30 ENCOUNTER — Ambulatory Visit
Admission: RE | Admit: 2022-12-30 | Discharge: 2022-12-30 | Disposition: A | Discharge status: Home / Self Care | Payer: Medicaid Other | Source: Ambulatory Visit | Attending: Internal Medicine | Admitting: Internal Medicine

## 2022-12-30 VITALS — BP 115/76 | HR 92 | Temp 98.1°F | Resp 16

## 2022-12-30 DIAGNOSIS — G44209 Tension-type headache, unspecified, not intractable: Secondary | ICD-10-CM

## 2022-12-30 MED ORDER — KETOROLAC TROMETHAMINE 30 MG/ML IJ SOLN
30.0000 mg | Freq: Once | INTRAMUSCULAR | Status: AC
  Administered 2022-12-30: 30 mg via INTRAMUSCULAR

## 2022-12-30 MED ORDER — ACETAMINOPHEN 325 MG PO TABS
975.0000 mg | ORAL_TABLET | Freq: Once | ORAL | Status: AC
  Administered 2022-12-30: 975 mg via ORAL

## 2022-12-30 NOTE — ED Provider Notes (Signed)
[facility]    CSN: [id number] Arrival date & time: 12/30/22  0843      History   Chief Complaint Chief Complaint  Patient presents with   Headache    I've had the same headache since Wednesday - Entered by patient    HPI Stacey Moss is a 22 y.o. female.   Patient presents to urgent care for evaluation of headche that started 5 days ago on Wednesday, December 25, 2022.  Headache worsens and improves in intensity but never fully goes away and is currently a 4 on a scale of 0-10.  Headache is mostly local to the left frontal region of the forehead but sometimes moves to the right forehead and intermittently wraps around to the back of the head.  Denies vision changes, dizziness, viral URI symptoms, fever/chills, neck pain, rash, and recent changes in medications.  No photophobia or photophobia reported.  Denies history of migraine headache.  Never smoker, denies drug use.  Reports history of severe environmental allergies for which she was using Zyrtec but recently switched using Claritin-D to see if this would help with headache.  She also attempted use of Flonase daily in hopes this would help relieve headache but no relief was found.  Of note, patient wears glasses for vision correction and was recently prescribed contact lenses 2 weeks ago that were faulty.  She states when she wore the contact lenses she had to strain her eyes to be able to see.  She followed back up with her ophthalmologist and contact lenses have now been replaced with non-faulty ones.  She believes this may have contributed to headache.  She used some ibuprofen when the headache first started 5 days ago but has not had any ibuprofen in the last 4 days since it "was not really working".   Headache   Past Medical History:  Diagnosis Date   Allergic conjunctivitis 11/10/2018   Allergy    seasonal   Asthma    Bursitis of intermetatarsal bursa of left foot 09/18/2018   Eczema    History of food allergy  11/10/2018   Perennial allergic rhinitis 03/21/2015   Reactive airway disease 10/29/2018   Symptom of wheezing 10/29/2018   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   Urticaria    Well child visit 03/21/2015    Patient Active Problem List   Diagnosis Date Noted   Chronic pain of left knee 11/29/2022   Seasonal allergic rhinitis due to pollen 10/07/2022   Moderate persistent asthma 11/10/2018   Atopic dermatitis 11/10/2018    Past Surgical History:  Procedure Laterality Date   WISDOM TOOTH EXTRACTION      OB History     Gravida  0   Para  0   Term  0   Preterm  0   AB  0   Living  0      SAB  0   IAB  0   Ectopic  0   Multiple  0   Live Births  0            Home Medications    Prior to Admission medications   Medication Sig Start Date End Date Taking? Authorizing Provider  meloxicam (MOBIC) 15 MG tablet Take 1 tablet (15 mg total) by mouth daily. 12/23/22   Lenda Kelp, MD    Family History Family History  Problem Relation Age of Onset   Arthritis Mother    Asthma Mother    Asthma Maternal Grandmother  Asthma Maternal Grandfather    Cancer Paternal Grandmother    Breast cancer Paternal Grandmother 61   Allergic rhinitis Brother     Social History Social History   Tobacco Use   Smoking status: Never   Smokeless tobacco: Never  Vaping Use   Vaping status: Never Used  Substance Use Topics   Alcohol use: Not Currently    Comment: occ   Drug use: No     Allergies   Justicia adhatoda (malabar nut tree) [justicia adhatoda] and Other   Review of Systems Review of Systems  Neurological:  Positive for headaches.  Per HPI   Physical Exam Triage Vital Signs ED Triage Vitals  Encounter Vitals Group     BP 12/30/22 0855 115/76     Systolic BP Percentile --      Diastolic BP Percentile --      Pulse Rate 12/30/22 0855 92     Resp 12/30/22 0855 16     Temp 12/30/22 0855 98.1 F (36.7 C)     Temp Source 12/30/22 0855 Oral     SpO2 12/30/22  0855 96 %     Weight --      Height --      Head Circumference --      Peak Flow --      Pain Score 12/30/22 0853 4     Pain Loc --      Pain Education --      Exclude from Growth Chart --    No data found.  Updated Vital Signs BP 115/76 (BP Location: Right Arm)   Pulse 92   Temp 98.1 F (36.7 C) (Oral)   Resp 16   LMP 12/15/2022   SpO2 96%   Visual Acuity Right Eye Distance:   Left Eye Distance:   Bilateral Distance:    Right Eye Near:   Left Eye Near:    Bilateral Near:     Physical Exam Vitals and nursing note reviewed.  Constitutional:      Appearance: She is not ill-appearing or toxic-appearing.  HENT:     Head: Normocephalic and atraumatic.     Right Ear: Hearing, tympanic membrane, ear canal and external ear normal.     Left Ear: Hearing, tympanic membrane, ear canal and external ear normal.     Nose: Nose normal.     Mouth/Throat:     Lips: Pink.     Mouth: Mucous membranes are moist. No injury.     Tongue: No lesions. Tongue does not deviate from midline.     Palate: No mass and lesions.     Pharynx: Oropharynx is clear. Uvula midline. No pharyngeal swelling, oropharyngeal exudate, posterior oropharyngeal erythema or uvula swelling.     Tonsils: No tonsillar exudate or tonsillar abscesses.  Eyes:     General: Lids are normal. Vision grossly intact. Gaze aligned appropriately.     Extraocular Movements: Extraocular movements intact.     Conjunctiva/sclera: Conjunctivae normal.  Cardiovascular:     Rate and Rhythm: Normal rate and regular rhythm.     Heart sounds: Normal heart sounds, S1 normal and S2 normal.  Pulmonary:     Effort: Pulmonary effort is normal. No respiratory distress.     Breath sounds: Normal breath sounds and air entry.  Musculoskeletal:     Cervical back: Neck supple.  Skin:    General: Skin is warm and dry.     Capillary Refill: Capillary refill takes less than 2 seconds.  Findings: No rash.  Neurological:     General: No  focal deficit present.     Mental Status: She is alert and oriented to person, place, and time. Mental status is at baseline.     Cranial Nerves: Cranial nerves 2-12 are intact. No dysarthria or facial asymmetry.     Sensory: Sensation is intact.     Motor: Motor function is intact.     Coordination: Coordination is intact.     Gait: Gait is intact.  Psychiatric:        Mood and Affect: Mood normal.        Speech: Speech normal.        Behavior: Behavior normal.        Thought Content: Thought content normal.        Judgment: Judgment normal.      UC Treatments / Results  Labs (all labs ordered are listed, but only abnormal results are displayed) Labs Reviewed - No data to display  EKG   Radiology No results found.  Procedures Procedures (including critical care time)  Medications Ordered in UC Medications  ketorolac (TORADOL) 30 MG/ML injection 30 mg (30 mg Intramuscular Given 12/30/22 0922)  acetaminophen (TYLENOL) tablet 975 mg (975 mg Oral Given 12/30/22 6578)    Initial Impression / Assessment and Plan / UC Course  I have reviewed the triage vital signs and the nursing notes.  Pertinent labs & imaging results that were available during my care of the patient were reviewed by me and considered in my medical decision making (see chart for details).   1.  Acute non-intractable tension type headache Presentation consistent with tension type headache.  Low suspicion for migrainous headache versus intracranial pathology. Will manage this with ketorolac 30 mg IM and Tylenol 975 mg in clinic.  No NSAIDs for 24 hours. She appears well-hydrated to physical exam and neurologic exam is without focal deficit. May start ibuprofen 600 mg every 6 hours as needed for headache tomorrow. Advised to continue to drink plenty of water and follow-up with ophthalmologist as needed.   Counseled patient on potential for adverse effects with medications prescribed/recommended today, strict  ER and return-to-clinic precautions discussed, patient verbalized understanding.    Final Clinical Impressions(s) / UC Diagnoses   Final diagnoses:  Acute non intractable tension-type headache     Discharge Instructions      You have been evaluated today for headache.  You were given medicines for your headache in the clinic today which included a strong NSAID, so do not  take ibuprofen or other NSAIDS (Aleve, aspirin, naproxen, ibuprofen, goody powder, etc.) for the next 12 hours.  Starting tomorrow, take 600mg  ibuprofen every 6 hours or tylenol 1,000 every 6 hours as needed for pain.  Avoid areas of loud noise/harsh light and remember to drink plenty of water to stay well hydrated.  Please follow up with your primary care provider for further management of your headaches.  Please seek emergency medical care if you experience worsening or uncontrolled pain, vision changes, recurrent vomiting, difficulty with normal activities, abnormal behavior, difficulty walking, numbness, weakness, or any other concerning symptoms.       ED Prescriptions   None    PDMP not reviewed this encounter.   Reita May [place], [place] 12/30/22 [phone]

## 2022-12-30 NOTE — ED Triage Notes (Signed)
Pt c/o HA x 5 days-no relief with ibuprofen-started claritin d and flonase-NAD-steady gait

## 2022-12-30 NOTE — Discharge Instructions (Addendum)

## 2022-12-31 ENCOUNTER — Ambulatory Visit
Admission: EM | Admit: 2022-12-31 | Discharge: 2022-12-31 | Disposition: A | Discharge status: Home / Self Care | Payer: Medicaid Other | Attending: Emergency Medicine | Admitting: Emergency Medicine

## 2022-12-31 DIAGNOSIS — R519 Headache, unspecified: Secondary | ICD-10-CM | POA: Diagnosis not present

## 2022-12-31 MED ORDER — PREDNISONE 20 MG PO TABS: 40.0000 mg | ORAL_TABLET | Freq: Every day | ORAL | 0 refills | Status: AC

## 2022-12-31 MED ORDER — AMOXICILLIN-POT CLAVULANATE 875-125 MG PO TABS: 1.0000 | ORAL_TABLET | Freq: Two times a day (BID) | ORAL | 0 refills | Status: DC

## 2022-12-31 MED ORDER — PREDNISONE 20 MG PO TABS
40.0000 mg | ORAL_TABLET | Freq: Once | ORAL | Status: AC
  Administered 2022-12-31: 40 mg via ORAL

## 2022-12-31 NOTE — ED Provider Notes (Signed)
HPI  SUBJECTIVE:  Stacey Moss is a 22 y.o. female who reports gradual onset, constant headache that starts behind her eyes and sinuses, wraps around her head to the occiput that waxes and wanes for the past week.  She reports nasal congestion, sinus pain and pressure beginning yesterday.  States that her upper teeth hurt, and that her allergies have been bothering her.  She states that her neck feels sore, but denies neck stiffness.  No fevers or nausea, photophobia, vomiting, vomiting, visual changes, rhinorrhea, facial swelling, ear, TMJ pain, seizures, syncope, dysarthria, focal weakness, facial numbness/droop, discoordination.  She denies having her head bent forward for prolonged periods of time.  No antipyretic in the past 6 hours.  Patient was seen here yesterday for this headache.  She was thought to have a tension type headache and was given with Toradol 30 mg IM and Tylenol 975 mg in clinic.  She was advised to start ibuprofen 600 mg every 6 hours today.  She states that she has done this with minimal improvement.  She states the Toradol/Tylenol did help.  Symptoms are worse with lying down.  She has a past medical history of TMJ arthralgia, worse on the left, asthma, year-round allergies.  No history of diabetes, hypertension, temporal arteritis, CVA, aneurysm.  LMP: 7/17.  Denies possibility being pregnant.  PCP: [place].  She has follow-up with her PCP tomorrow morning.  Past Medical History:  Diagnosis Date   Allergic conjunctivitis 11/10/2018   Allergy    seasonal   Asthma    Bursitis of intermetatarsal bursa of left foot 09/18/2018   Eczema    History of food allergy 11/10/2018   Perennial allergic rhinitis 03/21/2015   Reactive airway disease 10/29/2018   Symptom of wheezing 10/29/2018   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   Urticaria    Well child visit 03/21/2015    Past Surgical History:  Procedure Laterality Date   WISDOM TOOTH EXTRACTION      Family History  Problem  Relation Age of Onset   Arthritis Mother    Asthma Mother    Asthma Maternal Grandmother    Asthma Maternal Grandfather    Cancer Paternal Grandmother    Breast cancer Paternal Grandmother 34   Allergic rhinitis Brother     Social History   Tobacco Use   Smoking status: Never   Smokeless tobacco: Never  Vaping Use   Vaping status: Never Used  Substance Use Topics   Alcohol use: Not Currently    Comment: occ   Drug use: No    No current facility-administered medications for this encounter.  Current Outpatient Medications:    amoxicillin-clavulanate (AUGMENTIN) 875-125 MG tablet, Take 1 tablet by mouth every 12 (twelve) hours., Disp: 14 tablet, Rfl: 0   predniSONE (DELTASONE) 20 MG tablet, Take 2 tablets (40 mg total) by mouth daily with breakfast for 5 days., Disp: 10 tablet, Rfl: 0  Allergies  Allergen Reactions   Justicia Adhatoda (Malabar Nut Tree) [Justicia Adhatoda] Hives   Other     TREE NUTS     ROS  As noted in HPI.   Physical Exam  BP 108/72 (BP Location: Left Arm)   Pulse 70   Temp 98.5 F (36.9 C) (Oral)   Resp 18   LMP 12/15/2022   SpO2 98%   Constitutional: Well developed, well nourished, no acute distress Eyes: PERRL, EOMI, conjunctiva normal bilaterally.  No photophobia HENT: Normocephalic, atraumatic,mucus membranes moist, normal dentition.  TM normal b/l.  Left TMJ  tender.  Positive nasal congestion, left frontal sinus tenderness.  No maxillary tenderness.  No temporal artery tenderness.  Neck: no cervical LN.  Right trapezial muscle tenderness. No meningismus.  Tenderness at the occiput bilaterally. Respiratory: normal inspiratory effort Cardiovascular: Normal rate, regular rhythm GI:  nondistended skin: No rash, skin intact Musculoskeletal: No edema, no tenderness, no deformities Neurologic: Alert & oriented x 3, CN III-XII intact, Romberg neg, tandem gait steady Psychiatric: Speech and behavior appropriate   ED Course   Medications   predniSONE (DELTASONE) tablet 40 mg (40 mg Oral Given 12/31/22 2124)    No orders of the defined types were placed in this encounter.  No results found for this or any previous visit (from the past 24 hour(s)). No results found.   ED Clinical Impression  1. Acute nonintractable headache, unspecified headache type   2. Sinus headache     ED Assessment/Plan     Previous notes reviewed.  As noted HPI.  no sudden onset. Doubt SAH, ICH or space occupying lesion. Pt without fevers/chills, Pt has no meningeal sx, no nuchal rigidity. Doubt meningitis. Pt with normal neuro exam, no evidence of CVA/TIA.  Pt BP not elevated significantly, doubt hypertensive emergency. No evidence of temporal artery tenderness, no evidence of glaucoma or other ocular pathology.   Suspect sinus headache versus tension/musculoskeletal headache.  Will give 40 mg of prednisone here for sinus inflammation.  Will have her start Flonase, Tylenol to the ibuprofen.  Wait-and-see prescription of Augmentin for her to start after being evaluated by her PCP, if PCP agrees that this is a possible sinus infection.  She has follow-up with her PCP early tomorrow.  Written ER return precautions given.  Pt agrees with plan  Meds ordered this encounter  Medications   predniSONE (DELTASONE) tablet 40 mg   predniSONE (DELTASONE) 20 MG tablet    Sig: Take 2 tablets (40 mg total) by mouth daily with breakfast for 5 days.    Dispense:  10 tablet    Refill:  0   amoxicillin-clavulanate (AUGMENTIN) 875-125 MG tablet    Sig: Take 1 tablet by mouth every 12 (twelve) hours.    Dispense:  14 tablet    Refill:  0    *This clinic note was created using Scientist, clinical (histocompatibility and immunogenetics). Therefore, there may be occasional mistakes despite careful proofreading.  ?    Domenick Gong, MD 01/01/23 1246

## 2022-12-31 NOTE — Progress Notes (Unsigned)
   Patient ID: Stacey Moss, female    DOB: 2000-07-24, 22 y.o.   MRN: [id number]  No chief complaint on file.   HPI: Sinus HA:  seen in ED yesterday, given Augmentin & prednisone.  Assessment & Plan:  There are no diagnoses linked to this encounter.  Subjective:    Outpatient Medications Prior to Visit  Medication Sig Dispense Refill   amoxicillin-clavulanate (AUGMENTIN) 875-125 MG tablet Take 1 tablet by mouth every 12 (twelve) hours. 14 tablet 0   predniSONE (DELTASONE) 20 MG tablet Take 2 tablets (40 mg total) by mouth daily with breakfast for 5 days. 10 tablet 0   No facility-administered medications prior to visit.   Past Medical History:  Diagnosis Date   Allergic conjunctivitis 11/10/2018   Allergy    seasonal   Asthma    Bursitis of intermetatarsal bursa of left foot 09/18/2018   Eczema    History of food allergy 11/10/2018   Perennial allergic rhinitis 03/21/2015   Reactive airway disease 10/29/2018   Symptom of wheezing 10/29/2018   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   Urticaria    Well child visit 03/21/2015   Past Surgical History:  Procedure Laterality Date   WISDOM TOOTH EXTRACTION     Allergies  Allergen Reactions   Justicia Adhatoda (Malabar Nut Tree) [Justicia Adhatoda] Hives   Other     TREE NUTS      Objective:    Physical Exam Vitals and nursing note reviewed.  Constitutional:      Appearance: Normal appearance.  Cardiovascular:     Rate and Rhythm: Normal rate and regular rhythm.  Pulmonary:     Effort: Pulmonary effort is normal.     Breath sounds: Normal breath sounds.  Musculoskeletal:        General: Normal range of motion.  Skin:    General: Skin is warm and dry.  Neurological:     Mental Status: She is alert.  Psychiatric:        Mood and Affect: Mood normal.        Behavior: Behavior normal.    LMP 12/15/2022  Wt Readings from Last 3 Encounters:  12/23/22 143 lb (64.9 kg)  11/29/22 143 lb (64.9 kg)  11/21/22 142 lb 3.2 oz (64.5 kg)        Dulce Sellar, NP

## 2022-12-31 NOTE — ED Triage Notes (Signed)
Pt c/o HA x 1 week-seen here for same yesterday-states she has appt at PCP tomorrow-NAD-steady gait

## 2022-12-31 NOTE — Discharge Instructions (Signed)
Start Flonase, saline nasal irrigation with a [employer] Med rinse and distilled water as often as you want to help wash out a possible sinus infection.  Take 600 mg of ibuprofen by 1000 mg of Tylenol 3 times a day.  I have prescribed Augmentin, which is an antibiotic, for sinus infection after you are evaluated by your doctor.  If your doctor agrees that this is a sinus infection go ahead and start the Augmentin.  I would take the prednisone unless your provider tells you to not take it.  I have given you 40 mg of prednisone here.  Go to the ER for worst headache of your life, blurry vision, double vision, visual loss, neck stiffness, fevers, rash, stroke like symptoms, or for other concerns.

## 2023-01-01 ENCOUNTER — Ambulatory Visit (INDEPENDENT_AMBULATORY_CARE_PROVIDER_SITE_OTHER): Payer: Medicaid Other | Admitting: Family

## 2023-01-01 VITALS — BP 116/79 | HR 79 | Temp 97.5°F | Ht 66.0 in | Wt 143.2 lb

## 2023-01-01 DIAGNOSIS — R519 Headache, unspecified: Secondary | ICD-10-CM

## 2023-01-03 ENCOUNTER — Telehealth: Payer: Self-pay | Admitting: Family

## 2023-01-03 ENCOUNTER — Encounter: Payer: Self-pay | Admitting: Family

## 2023-01-03 ENCOUNTER — Ambulatory Visit: Payer: Medicaid Other | Admitting: Physical Therapy

## 2023-01-03 DIAGNOSIS — R519 Headache, unspecified: Secondary | ICD-10-CM

## 2023-01-03 MED ORDER — CYCLOBENZAPRINE HCL 5 MG PO TABS: 5.0000 mg | ORAL_TABLET | Freq: Three times a day (TID) | ORAL | 1 refills | Status: DC | PRN

## 2023-01-03 NOTE — Telephone Encounter (Signed)
Pt would like a call back concerning her last appt, she is taking her meds and is still having headaches, please advise.

## 2023-01-06 ENCOUNTER — Ambulatory Visit: Payer: Medicaid Other | Admitting: Family

## 2023-01-06 VITALS — BP 102/67 | HR 96 | Temp 98.4°F | Ht 66.0 in | Wt 148.4 lb

## 2023-01-06 DIAGNOSIS — R519 Headache, unspecified: Secondary | ICD-10-CM | POA: Diagnosis not present

## 2023-01-06 MED ORDER — NAPROXEN 500 MG PO TABS: 500.0000 mg | ORAL_TABLET | Freq: Two times a day (BID) | ORAL | 0 refills | Status: DC

## 2023-01-06 NOTE — Progress Notes (Signed)
Patient ID: Stacey Moss, female    DOB: 2000-12-09, 22 y.o.   MRN: [id number]  Chief Complaint  Patient presents with   Headache    HPI: Sinus HA:  HX from visit on 7/31 - seen in ED yesterday, given Toradol shot, Augmentin & prednisone. She has not started the augmentin yet. Reports ear hurting but no other sinus sx. States the shot and dose of prednisone yesterday has helped, no HA today.  *last visit advised to take the prednisone for 3 more days, hold the Augmentin as she was having no sinus sx. She sent a MyChart msg stating the HA had returned and was concerned if stemming from her neck as sometimes she feels in the base of her skull in the back of her head. Flexeril sent in to try, but she hasn't tried this yet. Pt reports seeing Chiropractor this am & had xray and given adjustment. She has had allergy testing in past and told she had allergies to a lot of different things, environmental, dust, pets and nuts. Advised to take Zyrtec and Singulair, and Flonase. Feels throbbing sensation in her temples and sometimes behind her eye on same side temple is hurting. Denies any sinus sx or sinus pain. pt denies any excess stress more than usual. Pain is never bad enough she has to lie down or stop working.  Assessment & Plan:  Persistent headaches- advised pt to take the Flexeril. Also sending Naproxen and ok to take the 2 meds together. Do this for the next 3-4 days to see if headache will go away. She can continue to see chiropractor if she thinks it is helping. She also may want to see eye doctor for full exam if has been longer than a year.  If pain continues, next steps would be to go back to allergist, physical therapy, or neurology/Headache clinic in town. Advised pt on triggers for HA and avoiding these triggers. Tried to reassure pt with no other sx she should not think the worse. However, if ever having the "worst headache of your life" to go straight to the ED.  - naproxen (NAPROSYN) 500 MG  tablet; Take 1 tablet (500 mg total) by mouth 2 (two) times daily with a meal. Take for 3-4days to get rid of headache.  Dispense: 30 tablet; Refill: 0  Subjective:    Outpatient Medications Prior to Visit  Medication Sig Dispense Refill   predniSONE (DELTASONE) 20 MG tablet Take 20 mg by mouth daily with breakfast.     amoxicillin-clavulanate (AUGMENTIN) 875-125 MG tablet Take 1 tablet by mouth every 12 (twelve) hours. (Patient not taking: Reported on 01/06/2023) 14 tablet 0   cyclobenzaprine (FLEXERIL) 5 MG tablet Take 1-2 tablets (5-10 mg total) by mouth 3 (three) times daily as needed (Take for headache/migraine.). (Patient not taking: Reported on 01/06/2023) 30 tablet 1   No facility-administered medications prior to visit.   Past Medical History:  Diagnosis Date   Allergic conjunctivitis 11/10/2018   Allergy    seasonal   Asthma    Bursitis of intermetatarsal bursa of left foot 09/18/2018   Eczema    History of food allergy 11/10/2018   Perennial allergic rhinitis 03/21/2015   Reactive airway disease 10/29/2018   Symptom of wheezing 10/29/2018   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   Urticaria    Well child visit 03/21/2015   Past Surgical History:  Procedure Laterality Date   WISDOM TOOTH EXTRACTION     Allergies  Allergen Reactions   Justicia Adhatoda (  Malabar Nut Tree) [Justicia Adhatoda] Hives   Other     TREE NUTS      Objective:    Physical Exam Vitals and nursing note reviewed.  Constitutional:      Appearance: Normal appearance.  HENT:     Right Ear: Tympanic membrane and ear canal normal.     Left Ear: Tympanic membrane and ear canal normal.     Nose:     Right Sinus: No maxillary sinus tenderness or frontal sinus tenderness.     Left Sinus: No maxillary sinus tenderness or frontal sinus tenderness.     Mouth/Throat:     Mouth: Mucous membranes are moist.     Pharynx: No pharyngeal swelling, oropharyngeal exudate, posterior oropharyngeal erythema, uvula swelling or  postnasal drip.     Tonsils: No tonsillar exudate or tonsillar abscesses.  Cardiovascular:     Rate and Rhythm: Normal rate and regular rhythm.  Pulmonary:     Effort: Pulmonary effort is normal.     Breath sounds: Normal breath sounds.  Musculoskeletal:        General: Normal range of motion.  Lymphadenopathy:     Head:     Right side of head: No submandibular, tonsillar, preauricular, posterior auricular or occipital adenopathy.     Left side of head: No submandibular, tonsillar, preauricular, posterior auricular or occipital adenopathy.     Cervical: No cervical adenopathy.     Upper Body:     Right upper body: No supraclavicular adenopathy.     Left upper body: No supraclavicular adenopathy.  Skin:    General: Skin is warm and dry.  Neurological:     Mental Status: She is alert.  Psychiatric:        Mood and Affect: Mood normal.        Behavior: Behavior normal.    BP 102/67   Pulse 96   Temp 98.4 F (36.9 C) (Temporal)   Ht 5\' 6"  (1.676 m)   Wt 148 lb 6.4 oz (67.3 kg)   LMP 12/15/2022   SpO2 99%   BMI 23.95 kg/m  Wt Readings from Last 3 Encounters:  01/06/23 148 lb 6.4 oz (67.3 kg)  01/01/23 143 lb 4 oz (65 kg)  12/23/22 143 lb (64.9 kg)       Dulce Sellar, NP

## 2023-01-09 NOTE — Therapy (Signed)
OUTPATIENT PHYSICAL THERAPY TREATMENT   Patient Name: Stacey Moss MRN: [id number] DOB:22-Oct-2000, 22 y.o., female Today's Date: 01/10/2023   END OF SESSION:  PT End of Session - 01/10/23 0854     Visit Number 3    Number of Visits 6    Date for PT Re-Evaluation 01/24/23    Authorization Type [facility]    Authorization - Number of Visits 27    PT Start Time 0850    PT Stop Time 0940    PT Time Calculation (min) 50 min    Activity Tolerance Patient tolerated treatment well    Behavior During Therapy [facility] for tasks assessed/performed               Past Medical History:  Diagnosis Date   Allergic conjunctivitis 11/10/2018   Allergy    seasonal   Asthma    Bursitis of intermetatarsal bursa of left foot 09/18/2018   Eczema    History of food allergy 11/10/2018   Perennial allergic rhinitis 03/21/2015   Reactive airway disease 10/29/2018   Symptom of wheezing 10/29/2018   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   Urticaria    Well child visit 03/21/2015   Past Surgical History:  Procedure Laterality Date   WISDOM TOOTH EXTRACTION     Patient Active Problem List   Diagnosis Date Noted   Chronic pain of left knee 11/29/2022   Seasonal allergic rhinitis due to pollen 10/07/2022   Moderate persistent asthma 11/10/2018   Atopic dermatitis 11/10/2018    PCP: Dulce Sellar, NP  REFERRING PROVIDER: Ralene Cork, DO  REFERRING DIAG: Chronic pain of left knee  THERAPY DIAG:  Chronic pain of left knee  Muscle weakness (generalized)  Rationale for Evaluation and Treatment: Rehabilitation  ONSET DATE: February 2024   SUBJECTIVE:  SUBJECTIVE STATEMENT: Patient reports things have been pretty good, it hasn't really hurt that much. She does report having a constant headache that could be related to her neck. She has seen a chiropractor and had x-rays done but the headache persists. Headache is more on the left side and she does report some neck tightness.   PAIN:  Are you  having pain? Yes:  NPRS scale: 0/10 Pain location: Left knee Pain description: Dull, aching, stabbing occasionally  Aggravating factors: Sitting, standing, walking Relieving factors: Elevating the leg and using heat  PERTINENT HISTORY: See PMH above  PRECAUTIONS: None  PATIENT GOALS: Pain relief and return to prior level of function   OBJECTIVE:  PATIENT SURVEYS:  FOTO 44% functional status  EDEMA:  Patient does demonstrate left ankle edema compared to right  MUSCLE LENGTH: Patient demonstrates limitations of left hamstring, ITB, and calf   PALPATION: Tender to palpation lateral hamstring, calf, and posterior knee, midline quad tenderness, tenderness over lateral femoral epicondyle and ITB  Patellar mobility WFL but with pain medial glide  01/10/2023: tenderness noted L>R upper trap and bilateral suboccipital region  LOWER EXTREMITY ROM:  Active ROM Right eval Left eval  Hip flexion    Hip extension    Hip abduction    Hip adduction    Hip internal rotation    Hip external rotation    Knee flexion 140 140  Knee extension 5 hyper 5 hyper  Ankle dorsiflexion 12 8  Ankle plantarflexion    Ankle inversion    Ankle eversion     (Blank rows = not tested)  Patient reports posterior knee pain with left knee hyperextension  LOWER EXTREMITY MMT:  MMT Right eval  Left eval  Hip flexion    Hip extension 4 4-  Hip abduction 4 4-  Hip adduction    Hip internal rotation    Hip external rotation    Knee flexion 5 4+  Knee extension 5 4+  Ankle dorsiflexion    Ankle plantarflexion    Ankle inversion    Ankle eversion     (Blank rows = not tested)  FUNCTIONAL TESTS:  Squat: weight shift to the right, decreased depth, excessive forward knee translation, dynamic knee valgus  01/10/2023: DNF endurance: 13 seconds; cervical AROM grossly WFL but patient report neck tightness and cracking with cervical flexion and extension movements   GAIT: Assistive device utilized:  None Level of assistance: Complete Independence Comments: Antalgic on left   TODAY'S TREATMENT:         [facility] Adult PT Treatment:                                                DATE: 01/10/2023 Therapeutic Exercise: Recumbent bike L3 x 5 min while taking subjective Supine hamstring and ITB stretch with strap 2 x 30 sec each Prone quad stretch 2 x 30 sec Modified side plank on knees 3 x 20 sec on left Figure-4 bridge 2 x 10 each SLR 2 x 10 on left with emphasis on quad activation and knee control Rear foot elevated split squat 2 x 10 with occasional UE support Standing calf stretch at wall 2 x 30 sec each Supine passive cervical retraction 10 x 5 sec Seated passive cervical retraction 10 x 5 sec Seated upper trap and levator scap stretch 2 x 15 sec each Manual: Skilled palpation and monitoring of muscle tension while performing TPDN Suboccipital release with gentle manual traction Trigger Point Dry Needling Treatment: Pre-treatment instruction: Patient instructed on dry needling rationale, procedures, and possible side effects including pain during treatment (achy,cramping feeling), bruising, drop of blood, lightheadedness, nausea, sweating. Patient Consent Given: Yes Education handout provided: No Muscles treated: Left upper trap and bilateral suboccipitals  Needle size and number: .30x55mm x 1 and .30x61mm x 2 Electrical stimulation performed: No Parameters: N/A Treatment response/outcome: Twitch response elicited and Palpable decrease in muscle tension Post-treatment instructions: Patient instructed to expect possible mild to moderate muscle soreness later today and/or tomorrow. Patient instructed in methods to reduce muscle soreness and to continue prescribed HEP. If patient was dry needled over the lung field, patient was instructed on signs and symptoms of pneumothorax and, however unlikely, to see immediate medical attention should they occur. Patient was also educated on signs and  symptoms of infection and to seek medical attention should they occur. Patient verbalized understanding of these instructions and education.   [facility] Adult PT Treatment:                                                DATE: 12/27/2022 Therapeutic Exercise: Recumbent bike L3 x 4 min while taking subjective Prone quad stretch 2 x 30 sec Slant board calf stretch 2 x 30 sec Supine hamstring and ITB stretch with strap 2 x 30 sec each Bridge 10 x 10 sec Quad set with heel prop 10 x 5 sec on left SLR x 10 on left Side clamshell  with green 2 x 10 on left Manual: Skilled palpation and monitoring of muscle tension while performing TPDN STM left quad, calf, and hamstring Trigger Point Dry Needling Treatment: Pre-treatment instruction: Patient instructed on dry needling rationale, procedures, and possible side effects including pain during treatment (achy,cramping feeling), bruising, drop of blood, lightheadedness, nausea, sweating. Patient Consent Given: Yes Education handout provided: No Muscles treated: Left rectus femoris, vastus lateralis, lateral gastroc  Needle size and number: .30x73mm x 5 Electrical stimulation performed: No Parameters: N/A Treatment response/outcome: Twitch response elicited and Palpable decrease in muscle tension Post-treatment instructions: Patient instructed to expect possible mild to moderate muscle soreness later today and/or tomorrow. Patient instructed in methods to reduce muscle soreness and to continue prescribed HEP. If patient was dry needled over the lung field, patient was instructed on signs and symptoms of pneumothorax and, however unlikely, to see immediate medical attention should they occur. Patient was also educated on signs and symptoms of infection and to seek medical attention should they occur. Patient verbalized understanding of these instructions and education.  [facility] Adult PT Treatment:                                                DATE:  12/13/2022 Therapeutic Exercise: Supine hamstring and ITB stretch with strap x 30 sec each Longsitting calf stretch with strap x 30 sec SMFR using tennis ball for left calf and hamstring SLR x 10 Side clamshell with red x 10  PATIENT EDUCATION:  Education details: HEP update, TPDN, reaching out to PCP for PT referral for neck/headaches Person educated: Patient Education method: Explanation, Demonstration, Tactile cues, Verbal cues, Handout Education comprehension: verbalized understanding, returned demonstration, verbal cues required, tactile cues required, and needs further education  HOME EXERCISE PROGRAM: Access Code: [email or web address]    ASSESSMENT: CLINICAL IMPRESSION: Patient tolerated therapy well with no adverse effects. She reports no pain with her left knee/leg and seems to be progressing well with her stretching and strengthening exercises. Progressed to standing strengthening with SL control and she does exhibit difficulty with knee control, demonstrating left dynamic knee valgus, and contralateral hip drop. Patient also notes persistent headache that caused her to cancel her PT appointment last week. She exhibits normal cervical motion but does have some muscular tightness of the upper traps and suboccipitals with tenderness and multiple trigger points noted. Performed TPDN for the cervical region with multiple twitch responses. Patient was provided exercises for cervical stretching and postural exercises with good tolerance. Patient would benefit from continued skilled PT to progress her mobility and strength in order to reduce pain and maximize functional ability.   OBJECTIVE IMPAIRMENTS: Abnormal gait, decreased activity tolerance, decreased ROM, decreased strength, impaired flexibility, and pain.   ACTIVITY LIMITATIONS: lifting, bending, sitting, standing, stairs, and locomotion level  PARTICIPATION LIMITATIONS: meal prep, cleaning, driving, shopping, community activity, and  occupation  PERSONAL FACTORS: Past/current experiences and Time since onset of injury/illness/exacerbation are also affecting patient's functional outcome.    GOALS: Goals reviewed with patient? Yes  SHORT TERM GOALS: Target date: 01/03/2023  Patient will be I with initial HEP in order to progress with therapy. Baseline: HEP provided at eval 01/10/2023: independent Goal status: MET  2.  Patient will report left knee pain </= 2/10 in order to reduce functional limitations Baseline: 5/10 01/10/2023: 0/10 Goal status: MET  3.  Patient will demonstrate proper  squat technique to return to gym exercise and strength left knee Baseline: patient exhibits deviations with squat technique 01/10/2023: continues to exhibit squat deviations Goal status: ONGOING  LONG TERM GOALS: Target date: 01/24/2023  Patient will be I with final HEP to maintain progress from PT. Baseline: HEP provided at eval Goal status: INITIAL  2.  Patient will report >/= 69% status on FOTO to indicate improved functional ability. Baseline: 44% functional status Goal status: INITIAL  3.  Patient will demonstrate left hip strength >/= 4/5 MMT and knee strength 5/5 MMT to improve standing and walking tolerance Baseline: see limitations above Goal status: INITIAL  4.  Patient will report no limitations with standing for work related tasks in order to reduce functional limitations Baseline: patient reports limitations at work Goal status: INITIAL   PLAN: PT FREQUENCY: 1x/week  PT DURATION: 6 weeks  PLANNED INTERVENTIONS: Therapeutic exercises, Therapeutic activity, Neuromuscular re-education, Balance training, Gait training, Patient/Family education, Self Care, Joint mobilization, Aquatic Therapy, Dry Needling, Electrical stimulation, Cryotherapy, Moist heat, Taping, Ionotophoresis 4mg /ml Dexamethasone, Manual therapy, and Re-evaluation  PLAN FOR NEXT SESSION: Review HEP and progress PRN, manual/TPDN for left  hamstring/calf/quad, progress stretching and strengthening for the left hip and knee, squat mechanics   Rosana Hoes, PT, DPT, LAT, ATC 01/10/23  10:07 AM Phone: [phone] Fax: [phone]

## 2023-01-10 ENCOUNTER — Other Ambulatory Visit: Payer: Self-pay

## 2023-01-10 ENCOUNTER — Encounter: Payer: Self-pay | Admitting: Physical Therapy

## 2023-01-10 ENCOUNTER — Ambulatory Visit: Payer: Medicaid Other | Attending: Sports Medicine | Admitting: Physical Therapy

## 2023-01-10 DIAGNOSIS — M6281 Muscle weakness (generalized): Secondary | ICD-10-CM

## 2023-01-10 DIAGNOSIS — M542 Cervicalgia: Secondary | ICD-10-CM | POA: Insufficient documentation

## 2023-01-10 DIAGNOSIS — R293 Abnormal posture: Secondary | ICD-10-CM | POA: Diagnosis present

## 2023-01-10 DIAGNOSIS — G8929 Other chronic pain: Secondary | ICD-10-CM

## 2023-01-10 DIAGNOSIS — M25562 Pain in left knee: Secondary | ICD-10-CM | POA: Diagnosis not present

## 2023-01-10 NOTE — Patient Instructions (Signed)
Access Code: [email or web address] URL: [email or web address] Date: 01/10/2023 Prepared by: Rosana Hoes  Exercises - Supine Hamstring Stretch with Strap  - 1-2 x daily - 3 reps - 30 seconds hold - Supine ITB Stretch with Strap  - 1-2 x daily - 3 reps - 30 seconds hold - Long Sitting Calf Stretch with Strap  - 1-2 x daily - 3 reps - 30 seconds hold - Calf Mobilization with Small Ball  - 1-2 x daily - 7 x weekly - 3 sets - 10 reps - Active Straight Leg Raise with Quad Set  - 1 x daily - 3 sets - 10 reps - Clam with Resistance  - 1 x daily - 3 sets - 10 reps - Bridge  - 1 x daily - 3 sets - 10 reps - 5 seconds hold - Supine Passive Cervical Retraction  - 2-3 x daily - 10 reps - 5 seconds hold - Seated Passive Cervical Retraction  - 2-3 x daily - 10 reps - 5 seconds hold - Seated Cervical Sidebending Stretch  - 2-3 x daily - 3 reps - 15 seconds hold - Seated Levator Scapulae Stretch  - 2-3 x daily - 3 reps - 15 seconds hold

## 2023-01-13 ENCOUNTER — Ambulatory Visit (INDEPENDENT_AMBULATORY_CARE_PROVIDER_SITE_OTHER): Payer: Medicaid Other | Admitting: Family

## 2023-01-13 VITALS — BP 109/74 | HR 95 | Temp 98.0°F | Ht 66.0 in | Wt 144.2 lb

## 2023-01-13 DIAGNOSIS — R140 Abdominal distension (gaseous): Secondary | ICD-10-CM

## 2023-01-13 DIAGNOSIS — M542 Cervicalgia: Secondary | ICD-10-CM

## 2023-01-13 NOTE — Progress Notes (Signed)
Patient ID: Stacey Moss, female    DOB: 26-Aug-2000, 22 y.o.   MRN: [id number]  Chief Complaint  Patient presents with   Neck Pain    Pt would like a referral to PT for neck pain.    Headache   Allergy Testing    HPI: Sinus HA/neck pain:  seen last week for headaches and neck pain, she is concerned if pain actually stemming from her neck as sometimes she feels in the base of her skull in the back of her head. She has seen PT for her knee/leg pain and wants to go back to them for her neck pain as told that dry needling & other exercises can be beneficial. Last visit advised she try the Flexeril that had been previously sent in and a refill of Naproxen was also sent. Pt reports seeing Chiropractor & had xray and given adjustment. Taking Zyrtec and Singulair, and Flonase daily for allergy sx. Feels throbbing sensation in her temples and sometimes behind her eye on same side temple is hurting. Denies any sinus sx or sinus pain.   Abdominal bloating:  Today, she says the chiropractor mentioned her stomach xray appeared as if her bowel may be full of gas &/or stool. She has had allergy testing in past and told she had allergies to a lot of different things, environmental, dust, pets and nuts. But she would like to check on food allergies including gluten today to see if causing her bloating and constipation issues. She reports having a BM about 2-3d/week.   Assessment & Plan:  Cervicalgia Provide a new referral for physical therapy for neck pain. Advised to continue to take Flexeril &/or Naproxen prn. F/U based on response to PT.  -     Ambulatory referral to Physical Therapy  Abdominal bloating checking labs for food allergy testing for dairy and gluten.. - Advise patient to drink 2 liters of water daily, increase fiber intake, and consider using Benefiber or Metamucil OTC to help with bowel movements. - Discuss the use of a generic stool softener if needed. Follow up as needed based on the results  of the allergy testing or if above interventions are not working.   -     Glia (IgA/G) + tTG IgA -     Food Allergy Profile   Subjective:    Outpatient Medications Prior to Visit  Medication Sig Dispense Refill   cyclobenzaprine (FLEXERIL) 5 MG tablet Take 1-2 tablets (5-10 mg total) by mouth 3 (three) times daily as needed (Take for headache/migraine.). 30 tablet 1   naproxen (NAPROSYN) 500 MG tablet Take 1 tablet (500 mg total) by mouth 2 (two) times daily with a meal. Take for 3-4days to get rid of headache. 30 tablet 0   predniSONE (DELTASONE) 20 MG tablet Take 20 mg by mouth daily with breakfast.     amoxicillin-clavulanate (AUGMENTIN) 875-125 MG tablet Take 1 tablet by mouth every 12 (twelve) hours. 14 tablet 0   No facility-administered medications prior to visit.   Past Medical History:  Diagnosis Date   Allergic conjunctivitis 11/10/2018   Allergy    seasonal   Asthma    Bursitis of intermetatarsal bursa of left foot 09/18/2018   Eczema    History of food allergy 11/10/2018   Perennial allergic rhinitis 03/21/2015   Reactive airway disease 10/29/2018   Symptom of wheezing 10/29/2018   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   Urticaria    Well child visit 03/21/2015   Past Surgical History:  Procedure  Laterality Date   WISDOM TOOTH EXTRACTION     Allergies  Allergen Reactions   Justicia Adhatoda (Malabar Nut Tree) [Justicia Adhatoda] Hives   Other     TREE NUTS      Objective:    Physical Exam Vitals and nursing note reviewed.  Constitutional:      Appearance: Normal appearance.  HENT:     Mouth/Throat:     Mouth: Mucous membranes are moist.  Cardiovascular:     Rate and Rhythm: Normal rate and regular rhythm.  Pulmonary:     Effort: Pulmonary effort is normal.     Breath sounds: Normal breath sounds.  Musculoskeletal:        General: Normal range of motion.  Skin:    General: Skin is warm and dry.  Neurological:     Mental Status: She is alert.  Psychiatric:         Mood and Affect: Mood normal.        Behavior: Behavior normal.    BP 109/74   Pulse 95   Temp 98 F (36.7 C) (Temporal)   Ht 5\' 6"  (1.676 m)   Wt 144 lb 3.2 oz (65.4 kg)   LMP 12/15/2022   SpO2 98%   BMI 23.27 kg/m  Wt Readings from Last 3 Encounters:  01/13/23 144 lb 3.2 oz (65.4 kg)  01/06/23 148 lb 6.4 oz (67.3 kg)  01/01/23 143 lb 4 oz (65 kg)      Dulce Sellar, NP

## 2023-01-14 ENCOUNTER — Encounter: Payer: Self-pay | Admitting: Family

## 2023-01-16 ENCOUNTER — Telehealth: Payer: Self-pay

## 2023-01-16 NOTE — Telephone Encounter (Signed)
Request transfer to another [facility] PCP  Pt is requesting to transfer FROM:  Dulce Sellar, NP Pt is requesting to transfer TO:  Dr. Felix Pacini Reason for requested transfer:  unhappy with pcp Best contact number: [phone]  Note:  Patient transferred from Dr. Claiborne Billings to Dulce Sellar on 09/13/22. No reason given.

## 2023-01-17 ENCOUNTER — Ambulatory Visit: Payer: Medicaid Other | Admitting: Physical Therapy

## 2023-01-17 NOTE — Telephone Encounter (Signed)
fine with me, thx.

## 2023-01-17 NOTE — Telephone Encounter (Signed)
Patient had requested transfer from this office 09/2022.  We approved transfer for her.  It is unfortunate she is not happy with her current care now.  Unfortunately, once patient's transfer from my care, I discourage transfers back. It is acceptable if she would like to establish with another provider within the [facility] system that better suits her needs.

## 2023-01-19 ENCOUNTER — Encounter (HOSPITAL_COMMUNITY): Payer: Self-pay | Admitting: Emergency Medicine

## 2023-01-19 ENCOUNTER — Encounter (HOSPITAL_COMMUNITY): Payer: Self-pay

## 2023-01-19 ENCOUNTER — Emergency Department (HOSPITAL_COMMUNITY)
Admission: EM | Admit: 2023-01-19 | Discharge: 2023-01-20 | Disposition: A | Discharge status: Home / Self Care | Payer: Medicaid Other | Attending: Emergency Medicine | Admitting: Emergency Medicine

## 2023-01-19 ENCOUNTER — Emergency Department (HOSPITAL_COMMUNITY)
Admission: EM | Admit: 2023-01-19 | Discharge: 2023-01-19 | Discharge status: Against Medical Advice | Payer: Medicaid Other | Attending: Emergency Medicine | Admitting: Emergency Medicine

## 2023-01-19 ENCOUNTER — Ambulatory Visit (HOSPITAL_COMMUNITY)
Admission: EM | Admit: 2023-01-19 | Discharge: 2023-01-19 | Disposition: A | Discharge status: Home / Self Care | Payer: Medicaid Other | Attending: Family Medicine | Admitting: Family Medicine

## 2023-01-19 ENCOUNTER — Other Ambulatory Visit: Payer: Self-pay

## 2023-01-19 DIAGNOSIS — R519 Headache, unspecified: Secondary | ICD-10-CM | POA: Insufficient documentation

## 2023-01-19 DIAGNOSIS — Z1152 Encounter for screening for COVID-19: Secondary | ICD-10-CM | POA: Diagnosis not present

## 2023-01-19 DIAGNOSIS — X58XXXA Exposure to other specified factors, initial encounter: Secondary | ICD-10-CM | POA: Insufficient documentation

## 2023-01-19 DIAGNOSIS — T783XXA Angioneurotic edema, initial encounter: Secondary | ICD-10-CM | POA: Diagnosis present

## 2023-01-19 DIAGNOSIS — H02846 Edema of left eye, unspecified eyelid: Secondary | ICD-10-CM | POA: Diagnosis not present

## 2023-01-19 DIAGNOSIS — Z5321 Procedure and treatment not carried out due to patient leaving prior to being seen by health care provider: Secondary | ICD-10-CM | POA: Insufficient documentation

## 2023-01-19 MED ORDER — DIPHENHYDRAMINE HCL 50 MG/ML IJ SOLN: 25.0000 mg | Freq: Once | INTRAMUSCULAR | Status: DC

## 2023-01-19 MED ORDER — PREDNISONE 20 MG PO TABS: 40.0000 mg | ORAL_TABLET | Freq: Every day | ORAL | 0 refills | Status: AC

## 2023-01-19 MED ORDER — FAMOTIDINE IN NACL 20-0.9 MG/50ML-% IV SOLN
20.0000 mg | Freq: Once | INTRAVENOUS | Status: DC
  Filled 2023-01-19: qty 50

## 2023-01-19 MED ORDER — FEXOFENADINE HCL 180 MG PO TABS: 180.0000 mg | ORAL_TABLET | Freq: Every day | ORAL | 0 refills | Status: DC

## 2023-01-19 NOTE — ED Triage Notes (Signed)
Pt reports intermittent headache x 1 month, reports usually left sided, reports waking up with am with left eye swelling, has been seen by PCP and UC for same, has taken prednisone and flexeril with no relief

## 2023-01-19 NOTE — ED Triage Notes (Signed)
Pt. Arrives POV c/o L sided facial swelling. She states that she has had an ongoing headache for a month. She took claritin before going to bed last night. When she woke up this morning there was swelling to the L. Eye. The swelling got worse throughout the day.

## 2023-01-19 NOTE — ED Triage Notes (Signed)
Pt c/o allergic reaction on her face. Pt states she woke up with some facial swelling. Pt states she has not tried any new foods or medication.

## 2023-01-19 NOTE — ED Provider Notes (Signed)
[facility]    CSN: [id number] Arrival date & time: 01/19/23  1145      History   Chief Complaint Chief Complaint  Patient presents with   Allergic Reaction    HPI Shizuka Eike is a 22 y.o. female.   The history is provided by the patient. No language interpreter was used.  Allergic Reaction Presenting symptoms: itching   Presenting symptoms: no difficulty breathing   Presenting symptoms comment:  Left facial swelling, itching on the face and chest which started a few hours ago. Swelling has improved since onset. She usually have eye swelling with her allergy but not the cheek. Hence this is different. Denies eating any nuts and denies new food contact Context: not animal exposure and not food allergies   Context comment:  She was recently started on Prednisone, Naproxen and Flexeril for intermittent headache. Relieved by:  Nothing Worsened by:  Nothing Ineffective treatments: She uses Zyrtec daily, last dose was 2 days ago because she took Claritin D yesterday. None today. Headache Location: Left sided headache which sometimes goes to the right side x 1 month on and off. Quality:  Sharp Severity currently:  4/10 (Her HA is about 4/10 in severity now. However, can be as bad as 8/10 in severity sometimes) Onset quality:  Gradual Timing:  Intermittent Chronicity:  New Similar to prior headaches: no   Context: activity and bright light   Context: not caffeine   Context comment:  Coughing x 2-3 days. She felf this is related to her allergy Relieved by:  Nothing Worsened by:  Nothing Ineffective treatments: Prednisone, Naproxen and Flexeril. Associated symptoms: no eye pain, no fever, no nausea, no visual change and no vomiting      Past Medical History:  Diagnosis Date   Allergic conjunctivitis 11/10/2018   Allergy    seasonal   Asthma    Bursitis of intermetatarsal bursa of left foot 09/18/2018   Eczema    History of food allergy 11/10/2018   Perennial  allergic rhinitis 03/21/2015   Reactive airway disease 10/29/2018   Symptom of wheezing 10/29/2018   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   Urticaria    Well child visit 03/21/2015    Patient Active Problem List   Diagnosis Date Noted   Chronic pain of left knee 11/29/2022   Seasonal allergic rhinitis due to pollen 10/07/2022   Moderate persistent asthma 11/10/2018   Atopic dermatitis 11/10/2018    Past Surgical History:  Procedure Laterality Date   WISDOM TOOTH EXTRACTION      OB History     Gravida  0   Para  0   Term  0   Preterm  0   AB  0   Living  0      SAB  0   IAB  0   Ectopic  0   Multiple  0   Live Births  0            Home Medications    Prior to Admission medications   Medication Sig Start Date End Date Taking? Authorizing Provider  fexofenadine (HM FEXOFENADINE HCL) 180 MG tablet Take 1 tablet (180 mg total) by mouth daily for 7 days. 01/19/23 01/26/23 Yes Doreene Eland, MD  predniSONE (DELTASONE) 20 MG tablet Take 2 tablets (40 mg total) by mouth daily for 5 days. 01/19/23 01/24/23 Yes Doreene Eland, MD  cyclobenzaprine (FLEXERIL) 5 MG tablet Take 1-2 tablets (5-10 mg total) by mouth 3 (three) times daily as  needed (Take for headache/migraine.). 01/03/23   Dulce Sellar, NP  naproxen (NAPROSYN) 500 MG tablet Take 1 tablet (500 mg total) by mouth 2 (two) times daily with a meal. Take for 3-4days to get rid of headache. 01/06/23   Dulce Sellar, NP    Family History Family History  Problem Relation Age of Onset   Arthritis Mother    Asthma Mother    Asthma Maternal Grandmother    Asthma Maternal Grandfather    Cancer Paternal Grandmother    Breast cancer Paternal Grandmother 104   Allergic rhinitis Brother     Social History Social History   Tobacco Use   Smoking status: Never   Smokeless tobacco: Never  Vaping Use   Vaping status: Never Used  Substance Use Topics   Alcohol use: Not Currently    Comment: occ   Drug use: No      Allergies   Justicia adhatoda (malabar nut tree) [justicia adhatoda] and Other   Review of Systems Review of Systems  Constitutional:  Negative for fever.  Eyes:  Negative for pain.  Gastrointestinal:  Negative for nausea and vomiting.  Skin:  Positive for itching.  Neurological:  Positive for headaches.     Physical Exam Triage Vital Signs ED Triage Vitals  Encounter Vitals Group     BP      Systolic BP Percentile      Diastolic BP Percentile      Pulse      Resp      Temp      Temp src      SpO2      Weight      Height      Head Circumference      Peak Flow      Pain Score      Pain Loc      Pain Education      Exclude from Growth Chart    No data found.  Updated Vital Signs BP 128/74 (BP Location: Left Arm)   Pulse 100   Temp 98.9 F (37.2 C) (Oral)   Resp 16   LMP 12/15/2022   SpO2 98%   Visual Acuity Right Eye Distance:   Left Eye Distance:   Bilateral Distance:    Right Eye Near:   Left Eye Near:    Bilateral Near:     Physical Exam Vitals and nursing note reviewed.  Constitutional:      General: She is not in acute distress.    Appearance: Normal appearance.  HENT:     Head:     Comments: The left side of her face is mildly erythematous with mild swelling. No tenderness.     Right Ear: Tympanic membrane and ear canal normal.     Left Ear: Tympanic membrane and ear canal normal.     Nose:     Comments: No lip or tongue swelling    Mouth/Throat:     Mouth: Mucous membranes are moist.     Pharynx: Oropharynx is clear. No oropharyngeal exudate or posterior oropharyngeal erythema.  Eyes:     Extraocular Movements: Extraocular movements intact.     Conjunctiva/sclera: Conjunctivae normal.     Pupils: Pupils are equal, round, and reactive to light.  Cardiovascular:     Rate and Rhythm: Normal rate and regular rhythm.     Heart sounds: Normal heart sounds. No murmur heard. Pulmonary:     Effort: Pulmonary effort is normal. No  respiratory distress.  Breath sounds: Normal breath sounds. No wheezing.  Musculoskeletal:     Cervical back: Neck supple.  Lymphadenopathy:     Cervical: No cervical adenopathy.  Neurological:     General: No focal deficit present.     Mental Status: She is oriented to person, place, and time.     Cranial Nerves: Cranial nerves 2-12 are intact.     Sensory: Sensation is intact.     Motor: Motor function is intact.     Coordination: Coordination is intact.     Deep Tendon Reflexes: Reflexes are normal and symmetric.      UC Treatments / Results  Labs (all labs ordered are listed, but only abnormal results are displayed) Labs Reviewed  SARS CORONAVIRUS 2 (TAT 6-24 HRS)    EKG   Radiology No results found.  Procedures Procedures (including critical care time)  Medications Ordered in UC Medications - No data to display  Initial Impression / Assessment and Plan / UC Course  I have reviewed the triage vital signs and the nursing notes.  Pertinent labs & imaging results that were available during my care of the patient were reviewed by me and considered in my medical decision making (see chart for details).  Clinical Course as of 01/19/23 1307  Sun Jan 19, 2023  1243 Facial swelling with no respiratory symptoms Likely angioedema with unknown exposure Hx of recent cough - ?? Angioedema secondary to COVID-19 COVID-19 test is pending. I will call with results Start Fexofenadine 180 mg every day x 7 days Prednisone 40 mg every day x 5 days Resume home Zyrtec once complete Fexofenadine and f/u with PCP soon for reeval ED precaution discussed  [KE]  1246 Subacute headache ?? Migraine headache No neurologic deficit F/U with PCP to discuss Imaging ED precaution discussed Use tylenol or Ibuprofen as needed for headache [KE]    Clinical Course User Index [KE] Doreene Eland, MD     Final Clinical Impressions(s) / UC Diagnoses   Final diagnoses:  Angioedema,  initial encounter  Nonintractable headache, unspecified chronicity pattern, unspecified headache type     Discharge Instructions      It was nice seeing you today. It seems you are reacting to something. Please use Fexofenadine and Prednisone as prescribed Request PCP referral for allergy testing Follow up with PCP for headache reevaluation and return to [place] or ED if symptoms worsens Should you develop difficulty breathing or itchy throat, please go to the emergency department right away.     ED Prescriptions     Medication Sig Dispense Auth. Provider   fexofenadine (HM FEXOFENADINE HCL) 180 MG tablet Take 1 tablet (180 mg total) by mouth daily for 7 days. 7 tablet Kahil Agner, Al Decant T, MD   predniSONE (DELTASONE) 20 MG tablet Take 2 tablets (40 mg total) by mouth daily for 5 days. 10 tablet Doreene Eland, MD      PDMP not reviewed this encounter.   Doreene Eland, MD 01/19/23 [phone]

## 2023-01-19 NOTE — ED Provider Notes (Signed)
[place] EMERGENCY DEPARTMENT AT [facility] Provider Note   CSN: [id number] Arrival date & time: 01/19/23  2247     History {Add pertinent medical, surgical, social history, OB history to HPI:1} Chief Complaint  Patient presents with   Facial Swelling    British Weinberg is a 22 y.o. female.  Patient here with intermittent left-sided headache for the past 1 month.  Headache starts at the base of her neck and radiates up to the left side of her forehead.  Comes and goes waxes and wanes in severity.  Denies thunderclap onset.  Denies any nausea, vomiting, photophobia or phonophobia.  She was seen in urgent care earlier in the day for left-sided facial and eye swelling which has since improved.  She was told she could have had a allergic reaction or possible angioedema.  She was prescribed antihistamines which she has not filled yet had a COVID test with the results are still pending.  States the swelling has improved.  She has no visual changes.  No difficulty breathing or difficulty swallowing.  No chest pain or shortness of breath.  No focal weakness, numbness or tingling.  Not having much of a headache currently.  No history of migraines.  Denies any visual changes.  States the facial swelling she had earlier since resolved.  Was more concerned about the headache she has been having for the past 1 month.  The history is provided by the patient.       Home Medications Prior to Admission medications   Medication Sig Start Date End Date Taking? Authorizing Provider  cyclobenzaprine (FLEXERIL) 5 MG tablet Take 1-2 tablets (5-10 mg total) by mouth 3 (three) times daily as needed (Take for headache/migraine.). 01/03/23   Dulce Sellar, NP  fexofenadine (HM FEXOFENADINE HCL) 180 MG tablet Take 1 tablet (180 mg total) by mouth daily for 7 days. 01/19/23 01/26/23  Doreene Eland, MD  naproxen (NAPROSYN) 500 MG tablet Take 1 tablet (500 mg total) by mouth 2 (two) times daily with a  meal. Take for 3-4days to get rid of headache. 01/06/23   Dulce Sellar, NP  predniSONE (DELTASONE) 20 MG tablet Take 2 tablets (40 mg total) by mouth daily for 5 days. 01/19/23 01/24/23  Doreene Eland, MD      Allergies    Bevelyn Buckles (malabar nut tree) [justicia adhatoda] and Other    Review of Systems   Review of Systems  Constitutional:  Negative for activity change, appetite change and fever.  HENT:  Negative for congestion.   Eyes:  Negative for visual disturbance.  Respiratory:  Negative for cough, chest tightness and shortness of breath.   Cardiovascular:  Negative for chest pain.  Gastrointestinal:  Negative for abdominal pain, nausea and vomiting.  Genitourinary:  Negative for dysuria and hematuria.  Musculoskeletal:  Negative for arthralgias.  Skin:  Negative for rash.  Neurological:  Positive for headaches. Negative for dizziness, seizures and weakness.   all other systems are negative except as noted in the HPI and PMH.    Physical Exam Updated Vital Signs BP 138/86 (BP Location: Right Arm)   Pulse 100   Temp 99.2 F (37.3 C) (Oral)   Resp 17   LMP 01/19/2023 (Exact Date)   SpO2 99%  Physical Exam Vitals and nursing note reviewed.  Constitutional:      General: She is not in acute distress.    Appearance: She is well-developed.  HENT:     Head: Normocephalic and atraumatic.  Mouth/Throat:     Pharynx: No oropharyngeal exudate.     Comments: Left-sided facial swelling.  No tongue or lip swelling.  No drooling Eyes:     Conjunctiva/sclera: Conjunctivae normal.     Pupils: Pupils are equal, round, and reactive to light.  Neck:     Comments: No meningismus. Cardiovascular:     Rate and Rhythm: Normal rate and regular rhythm.     Heart sounds: Normal heart sounds. No murmur heard. Pulmonary:     Effort: Pulmonary effort is normal. No respiratory distress.     Breath sounds: Normal breath sounds.  Abdominal:     Palpations: Abdomen is soft.      Tenderness: There is no abdominal tenderness. There is no guarding or rebound.  Musculoskeletal:        General: No tenderness. Normal range of motion.     Cervical back: Normal range of motion and neck supple.  Skin:    General: Skin is warm.  Neurological:     Mental Status: She is alert and oriented to person, place, and time.     Cranial Nerves: No cranial nerve deficit.     Motor: No abnormal muscle tone.     Coordination: Coordination normal.     Comments:  5/5 strength throughout. CN 2-12 intact.Equal grip strength.   Psychiatric:        Behavior: Behavior normal.     ED Results / Procedures / Treatments   Labs (all labs ordered are listed, but only abnormal results are displayed) Labs Reviewed  BASIC METABOLIC PANEL  CBC WITH DIFFERENTIAL/PLATELET  HCG, SERUM, QUALITATIVE    EKG None  Radiology No results found.  Procedures Procedures  {Document cardiac monitor, telemetry assessment procedure when appropriate:1}  Medications Ordered in ED Medications  famotidine (PEPCID) IVPB 20 mg premix (has no administration in time range)  diphenhydrAMINE (BENADRYL) injection 25 mg (has no administration in time range)    ED Course/ Medical Decision Making/ A&P   {   Click here for ABCD2, HEART and other calculatorsREFRESH Note before signing :1}                              Medical Decision Making Amount and/or Complexity of Data Reviewed Labs: ordered. Decision-making details documented in ED Course. Radiology: ordered and independent interpretation performed. Decision-making details documented in ED Course. ECG/medicine tests: ordered and independent interpretation performed. Decision-making details documented in ED Course.  Risk Prescription drug management.   Intermittent headache for the past 1 month, ongoing.  No neurological deficits.  No fever.  No meningismus.  5/5 strength throughout.  {Document critical care time when appropriate:1} {Document  review of labs and clinical decision tools ie heart score, Chads2Vasc2 etc:1}  {Document your independent review of radiology images, and any outside records:1} {Document your discussion with family members, caretakers, and with consultants:1} {Document social determinants of health affecting pt's care:1} {Document your decision making why or why not admission, treatments were needed:1} Final Clinical Impression(s) / ED Diagnoses Final diagnoses:  None    Rx / DC Orders ED Discharge Orders     None

## 2023-01-19 NOTE — Discharge Instructions (Addendum)
It was nice seeing you today. It seems you are reacting to something. Please use Fexofenadine and Prednisone as prescribed Request PCP referral for allergy testing Follow up with PCP for headache reevaluation and return to [place] or ED if symptoms worsens Should you develop difficulty breathing or itchy throat, please go to the emergency department right away.

## 2023-01-20 ENCOUNTER — Emergency Department (HOSPITAL_COMMUNITY): Payer: Medicaid Other

## 2023-01-20 LAB — CBC WITH DIFFERENTIAL/PLATELET
Abs Immature Granulocytes: 0.01 10*3/uL (ref 0.00–0.07)
Basophils Absolute: 0 10*3/uL (ref 0.0–0.1)
Basophils Relative: 0 %
Eosinophils Absolute: 0.1 10*3/uL (ref 0.0–0.5)
Eosinophils Relative: 2 %
HCT: 41.5 % (ref 36.0–46.0)
Hemoglobin: 13.7 g/dL (ref 12.0–15.0)
Immature Granulocytes: 0 %
Lymphocytes Relative: 23 %
Lymphs Abs: 1.6 10*3/uL (ref 0.7–4.0)
MCH: 29.4 pg (ref 26.0–34.0)
MCHC: 33 g/dL (ref 30.0–36.0)
MCV: 89.1 fL (ref 80.0–100.0)
Monocytes Absolute: 0.3 10*3/uL (ref 0.1–1.0)
Monocytes Relative: 5 %
Neutro Abs: 4.8 10*3/uL (ref 1.7–7.7)
Neutrophils Relative %: 70 %
Platelets: 197 10*3/uL (ref 150–400)
RBC: 4.66 MIL/uL (ref 3.87–5.11)
RDW: 12 % (ref 11.5–15.5)
WBC: 6.9 10*3/uL (ref 4.0–10.5)
nRBC: 0 % (ref 0.0–0.2)

## 2023-01-20 LAB — BASIC METABOLIC PANEL
Anion gap: 7 (ref 5–15)
BUN: 8 mg/dL (ref 6–20)
CO2: 24 mmol/L (ref 22–32)
Calcium: 9.4 mg/dL (ref 8.9–10.3)
Chloride: 105 mmol/L (ref 98–111)
Creatinine, Ser: 0.75 mg/dL (ref 0.44–1.00)
GFR, Estimated: >60 mL/min (ref >60)
Glucose, Bld: 105 mg/dL — ABNORMAL HIGH (ref 70–99)
Potassium: 3.5 mmol/L (ref 3.5–5.1)
Sodium: 136 mmol/L (ref 135–145)

## 2023-01-20 LAB — SARS CORONAVIRUS 2 (TAT 6-24 HRS): SARS Coronavirus 2: NEGATIVE

## 2023-01-20 LAB — HCG, SERUM, QUALITATIVE: Preg, Serum: NEGATIVE

## 2023-01-20 MED ORDER — METOCLOPRAMIDE HCL 5 MG/ML IJ SOLN
10.0000 mg | Freq: Once | INTRAMUSCULAR | Status: DC
  Filled 2023-01-20: qty 2

## 2023-01-20 MED ORDER — DIPHENHYDRAMINE HCL 50 MG/ML IJ SOLN
25.0000 mg | Freq: Once | INTRAMUSCULAR | Status: AC
  Administered 2023-01-20: 25 mg via INTRAVENOUS
  Filled 2023-01-20: qty 1

## 2023-01-20 MED ORDER — IOHEXOL 350 MG/ML SOLN
75.0000 mL | Freq: Once | INTRAVENOUS | Status: AC | PRN
  Administered 2023-01-20: 75 mL via INTRAVENOUS

## 2023-01-20 NOTE — Discharge Instructions (Addendum)
Your CT scan is normal.  Continue your antihistamines and muscle relaxers as prescribed.  You may use Tylenol or ibuprofen as needed for headache.  May alternate one of the other every 3 hours.  Return to the ED with change in character of her headache, nausea, vomiting, confusion, unilateral weakness, numbness, tingling, difficulty speaking, difficulty swallowing or other concerns.

## 2023-01-21 ENCOUNTER — Ambulatory Visit: Payer: Medicaid Other | Admitting: Physical Therapy

## 2023-01-21 ENCOUNTER — Encounter: Payer: Self-pay | Admitting: Physical Therapy

## 2023-01-21 DIAGNOSIS — M25562 Pain in left knee: Secondary | ICD-10-CM | POA: Diagnosis not present

## 2023-01-21 DIAGNOSIS — M6281 Muscle weakness (generalized): Secondary | ICD-10-CM

## 2023-01-21 DIAGNOSIS — G8929 Other chronic pain: Secondary | ICD-10-CM

## 2023-01-21 NOTE — Therapy (Signed)
OUTPATIENT PHYSICAL THERAPY TREATMENT   Patient Name: Stacey Moss MRN: [id number] DOB:Jun 23, 2000, 22 y.o., female Today's Date: 01/21/2023   END OF SESSION:  PT End of Session - 01/21/23 0810     Visit Number 4    Number of Visits 6    Date for PT Re-Evaluation 01/24/23    Authorization Type [employer]    PT Start Time 0810    PT Stop Time 0845    PT Time Calculation (min) 35 min               Past Medical History:  Diagnosis Date   Allergic conjunctivitis 11/10/2018   Allergy    seasonal   Asthma    Bursitis of intermetatarsal bursa of left foot 09/18/2018   Eczema    History of food allergy 11/10/2018   Perennial allergic rhinitis 03/21/2015   Reactive airway disease 10/29/2018   Symptom of wheezing 10/29/2018   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   Urticaria    Well child visit 03/21/2015   Past Surgical History:  Procedure Laterality Date   WISDOM TOOTH EXTRACTION     Patient Active Problem List   Diagnosis Date Noted   Chronic pain of left knee 11/29/2022   Seasonal allergic rhinitis due to pollen 10/07/2022   Moderate persistent asthma 11/10/2018   Atopic dermatitis 11/10/2018    PCP: Dulce Sellar, NP  REFERRING PROVIDER: Ralene Cork, DO  REFERRING DIAG: Chronic pain of left knee  THERAPY DIAG:  Chronic pain of left knee  Muscle weakness (generalized)  Rationale for Evaluation and Treatment: Rehabilitation  ONSET DATE: February 2024   SUBJECTIVE:  SUBJECTIVE STATEMENT: Pt reports a new referral for her neck should be here. She has been working on the neck stretches she was given. Intermittent medical knee aching/twitching. No pain at the moment.    PAIN:  Are you having pain? Yes:  NPRS scale: 0/10 Pain location: Left knee Pain description: Dull, aching, stabbing occasionally  Aggravating factors: Sitting, standing, walking Relieving factors: Elevating the leg and using heat  PERTINENT HISTORY: See PMH above  PRECAUTIONS:  None  PATIENT GOALS: Pain relief and return to prior level of function   OBJECTIVE:  PATIENT SURVEYS:  FOTO 44% functional status  EDEMA:  Patient does demonstrate left ankle edema compared to right  MUSCLE LENGTH: Patient demonstrates limitations of left hamstring, ITB, and calf   PALPATION: Tender to palpation lateral hamstring, calf, and posterior knee, midline quad tenderness, tenderness over lateral femoral epicondyle and ITB  Patellar mobility WFL but with pain medial glide  01/10/2023: tenderness noted L>R upper trap and bilateral suboccipital region  LOWER EXTREMITY ROM:  Active ROM Right eval Left eval  Hip flexion    Hip extension    Hip abduction    Hip adduction    Hip internal rotation    Hip external rotation    Knee flexion 140 140  Knee extension 5 hyper 5 hyper  Ankle dorsiflexion 12 8  Ankle plantarflexion    Ankle inversion    Ankle eversion     (Blank rows = not tested)  Patient reports posterior knee pain with left knee hyperextension  LOWER EXTREMITY MMT:  MMT Right eval Left eval Left 01/21/23  Hip flexion     Hip extension 4 4- 4-  Hip abduction 4 4- 4+  Hip adduction     Hip internal rotation     Hip external rotation     Knee flexion 5 4+   Knee  extension 5 4+   Ankle dorsiflexion     Ankle plantarflexion     Ankle inversion     Ankle eversion      (Blank rows = not tested)  FUNCTIONAL TESTS:  Squat: weight shift to the right, decreased depth, excessive forward knee translation, dynamic knee valgus  01/10/2023: DNF endurance: 13 seconds; cervical AROM grossly WFL but patient report neck tightness and cracking with cervical flexion and extension movements   GAIT: Assistive device utilized: None Level of assistance: Complete Independence Comments: Antalgic on left   TODAY'S TREATMENT:         [facility] Adult PT Treatment:                                                DATE: 01/21/23 Therapeutic Exercise: Rec Bike L2 x 5  minutes  Hip abduction x 20  Figure 4 bridge x 20 each Supine chin tuck towel roll 5 sec x 12 Supine chin tuck with dowel pullovers x 10 Seated upper trap and levator stretches STS focusing on equal weight x 10+HEP    [facility] Adult PT Treatment:                                                DATE: 01/10/2023 Therapeutic Exercise: Recumbent bike L3 x 5 min while taking subjective Supine hamstring and ITB stretch with strap 2 x 30 sec each Prone quad stretch 2 x 30 sec Modified side plank on knees 3 x 20 sec on left Figure-4 bridge 2 x 10 each SLR 2 x 10 on left with emphasis on quad activation and knee control Rear foot elevated split squat 2 x 10 with occasional UE support Standing calf stretch at wall 2 x 30 sec each Supine passive cervical retraction 10 x 5 sec Seated passive cervical retraction 10 x 5 sec Seated upper trap and levator scap stretch 2 x 15 sec each Manual: Skilled palpation and monitoring of muscle tension while performing TPDN Suboccipital release with gentle manual traction Trigger Point Dry Needling Treatment: Pre-treatment instruction: Patient instructed on dry needling rationale, procedures, and possible side effects including pain during treatment (achy,cramping feeling), bruising, drop of blood, lightheadedness, nausea, sweating. Patient Consent Given: Yes Education handout provided: No Muscles treated: Left upper trap and bilateral suboccipitals  Needle size and number: .30x75mm x 1 and .30x37mm x 2 Electrical stimulation performed: No Parameters: N/A Treatment response/outcome: Twitch response elicited and Palpable decrease in muscle tension Post-treatment instructions: Patient instructed to expect possible mild to moderate muscle soreness later today and/or tomorrow. Patient instructed in methods to reduce muscle soreness and to continue prescribed HEP. If patient was dry needled over the lung field, patient was instructed on signs and symptoms of  pneumothorax and, however unlikely, to see immediate medical attention should they occur. Patient was also educated on signs and symptoms of infection and to seek medical attention should they occur. Patient verbalized understanding of these instructions and education.   [facility] Adult PT Treatment:  DATE: 12/27/2022 Therapeutic Exercise: Recumbent bike L3 x 4 min while taking subjective Prone quad stretch 2 x 30 sec Slant board calf stretch 2 x 30 sec Supine hamstring and ITB stretch with strap 2 x 30 sec each Bridge 10 x 10 sec Quad set with heel prop 10 x 5 sec on left SLR x 10 on left Side clamshell with green 2 x 10 on left Manual: Skilled palpation and monitoring of muscle tension while performing TPDN STM left quad, calf, and hamstring Trigger Point Dry Needling Treatment: Pre-treatment instruction: Patient instructed on dry needling rationale, procedures, and possible side effects including pain during treatment (achy,cramping feeling), bruising, drop of blood, lightheadedness, nausea, sweating. Patient Consent Given: Yes Education handout provided: No Muscles treated: Left rectus femoris, vastus lateralis, lateral gastroc  Needle size and number: .30x48mm x 5 Electrical stimulation performed: No Parameters: N/A Treatment response/outcome: Twitch response elicited and Palpable decrease in muscle tension Post-treatment instructions: Patient instructed to expect possible mild to moderate muscle soreness later today and/or tomorrow. Patient instructed in methods to reduce muscle soreness and to continue prescribed HEP. If patient was dry needled over the lung field, patient was instructed on signs and symptoms of pneumothorax and, however unlikely, to see immediate medical attention should they occur. Patient was also educated on signs and symptoms of infection and to seek medical attention should they occur. Patient verbalized understanding of  these instructions and education.  [facility] Adult PT Treatment:                                                DATE: 12/13/2022 Therapeutic Exercise: Supine hamstring and ITB stretch with strap x 30 sec each Longsitting calf stretch with strap x 30 sec SMFR using tennis ball for left calf and hamstring SLR x 10 Side clamshell with red x 10  PATIENT EDUCATION:  Education details: HEP update, TPDN, reaching out to PCP for PT referral for neck/headaches Person educated: Patient Education method: Explanation, Demonstration, Tactile cues, Verbal cues, Handout Education comprehension: verbalized understanding, returned demonstration, verbal cues required, tactile cues required, and needs further education  HOME EXERCISE PROGRAM: Access Code: [email or web address]    ASSESSMENT: CLINICAL IMPRESSION: Pt arrives to 4th appointment over 6 week plan of care. She has missed a couple of appointments. Overall her knee is improving. She can tolerated longer time on her feet while working at [employer]. Her FOTO score has improved. LTG# 2 met.  Her hip strength has improved for abduction. Progressed to closed chain knee strength via STS which she reported some discomfort in her knee, not pain. Added STS to HEP to progress toward STG #3 (proper squat technique without compensations). A new referral was received for her neck pain. She has already started DNF strengthening and upper trap/levator stretches. She reports TPDN last session was helpful. She was scheduled for an evaluation next week to extend knee POC and add neck to POC.  Patient would benefit from continued skilled PT to progress her mobility and strength in order to reduce pain and maximize functional ability.   OBJECTIVE IMPAIRMENTS: Abnormal gait, decreased activity tolerance, decreased ROM, decreased strength, impaired flexibility, and pain.   ACTIVITY LIMITATIONS: lifting, bending, sitting, standing, stairs, and locomotion level  PARTICIPATION LIMITATIONS:  meal prep, cleaning, driving, shopping, community activity, and occupation  PERSONAL FACTORS: Past/current experiences and Time since onset of injury/illness/exacerbation are  also affecting patient's functional outcome.    GOALS: Goals reviewed with patient? Yes  SHORT TERM GOALS: Target date: 01/03/2023  Patient will be I with initial HEP in order to progress with therapy. Baseline: HEP provided at eval 01/10/2023: independent Goal status: MET  2.  Patient will report left knee pain </= 2/10 in order to reduce functional limitations Baseline: 5/10 01/10/2023: 0/10 Goal status: MET  3.  Patient will demonstrate proper squat technique to return to gym exercise and strength left knee Baseline: patient exhibits deviations with squat technique 01/10/2023: continues to exhibit squat deviations Goal status: ONGOING  LONG TERM GOALS: Target date: 01/24/2023  Patient will be I with final HEP to maintain progress from PT. Baseline: HEP provided at eval Goal status: ONGOING  2.  Patient will report >/= 69% status on FOTO to indicate improved functional ability. Baseline: 44% functional status 01/21/23: 72% Goal status: MET  3.  Patient will demonstrate left hip strength >/= 4/5 MMT and knee strength 5/5 MMT to improve standing and walking tolerance Baseline: see limitations above 01/21/23: see chart Goal status: ONGOING  4.  Patient will report no limitations with standing for work related tasks in order to reduce functional limitations Baseline: patient reports limitations at work 01/21/23: 2-3 hours knee pain will increase (previously 45 minutes )  Goal status: ONGOING, improving   PLAN: PT FREQUENCY: 1x/week  PT DURATION: 6 weeks  PLANNED INTERVENTIONS: Therapeutic exercises, Therapeutic activity, Neuromuscular re-education, Balance training, Gait training, Patient/Family education, Self Care, Joint mobilization, Aquatic Therapy, Dry Needling, Electrical stimulation, Cryotherapy,  Moist heat, Taping, Ionotophoresis 4mg /ml Dexamethasone, Manual therapy, and Re-evaluation  PLAN FOR NEXT SESSION: EVAL neck/ re-eval knee.  Review HEP and progress PRN, manual/TPDN for left hamstring/calf/quad, progress stretching and strengthening for the left hip and knee, squat mechanics   Jannette Spanner, PTA 01/21/23 10:43 AM Phone: [phone] Fax: [phone]

## 2023-01-22 ENCOUNTER — Telehealth: Payer: Self-pay | Admitting: Family

## 2023-01-22 ENCOUNTER — Encounter: Payer: Self-pay | Admitting: Neurology

## 2023-01-22 NOTE — Telephone Encounter (Signed)
Pt would like to transfer care to [facility].

## 2023-01-22 NOTE — Telephone Encounter (Signed)
Patient aware Dr. Claiborne Billings declined transfer of care.  Referred patient to call other [facility] offices, provided her with phone number of offices.

## 2023-01-22 NOTE — Telephone Encounter (Signed)
fine with me.

## 2023-01-29 ENCOUNTER — Ambulatory Visit (INDEPENDENT_AMBULATORY_CARE_PROVIDER_SITE_OTHER): Payer: Medicaid Other | Admitting: Family Medicine

## 2023-01-29 ENCOUNTER — Telehealth: Payer: Self-pay | Admitting: Family Medicine

## 2023-01-29 ENCOUNTER — Other Ambulatory Visit (HOSPITAL_BASED_OUTPATIENT_CLINIC_OR_DEPARTMENT_OTHER): Payer: Self-pay

## 2023-01-29 ENCOUNTER — Encounter: Payer: Self-pay | Admitting: Family Medicine

## 2023-01-29 VITALS — BP 112/71 | HR 84 | Ht 66.0 in | Wt 141.0 lb

## 2023-01-29 DIAGNOSIS — G4452 New daily persistent headache (NDPH): Secondary | ICD-10-CM

## 2023-01-29 MED ORDER — AMOXICILLIN-POT CLAVULANATE 875-125 MG PO TABS
1.0000 | ORAL_TABLET | Freq: Two times a day (BID) | ORAL | 0 refills | Status: AC
Start: 2023-01-29 — End: 2023-02-05
  Filled 2023-01-29: qty 14, 7d supply, fill #0

## 2023-01-29 NOTE — Telephone Encounter (Signed)
Darel Hong from [facility] called to advise they do not see patients for headache.  She recommends sending patient to an optometrist for a routine eye exam and then they may refer to a specialist if the headaches continue. Please refer patient to another location

## 2023-01-29 NOTE — Progress Notes (Signed)
New Patient Office Visit  Subjective    Patient ID: Stacey Moss, female    DOB: [birth year]  Age: 22 y.o. MRN: [id number]  CC:  Chief Complaint  Patient presents with   Establish Care    HPI Stacey Moss presents to establish care. She is transferring from another [place] office.    Discussed the use of AI scribe software for clinical note transcription with the patient, who gave verbal consent to proceed.  History of Present Illness   The patient, with a history of seasonal allergies, asthma, eczema, food allergies, and TMJ, presents with a new onset of persistent headaches since mid-July. The headaches, initially mild and intermittent, have progressively worsened, with the pain primarily located in the frontal sinuses and top of the head. The patient also reports associated neck pain, particularly on the left side. An x-ray of the neck at chiropractor revealed a lack of curvature, which has been addressed with chiropractic adjustments and physical therapy.  The patient denies any significant changes in diet or lifestyle that could contribute to the headaches. However, she did note a cessation of caffeine intake during a trip to [place] in June, although the headaches did not coincide with this change.  In addition to the headaches, the patient reports a recent increase in drainage, cough, and chest soreness, which she attributes to a possible sinus infection. She has been prescribed prednisone and an antibiotic to manage these symptoms, but did not take the antibiotic .  The patient also reports potential vision issues, particularly with the right eye, which she describes as needing to "try extra hard to focus." She has recently had her eyes checked and received new contacts, but has not noticed a significant improvement. She would like a second opinion on her eyes.  The patient denies any use of alcohol, recreational drugs, or nicotine. She is sexually active with a female partner and  uses condoms for protection. She has regular menstrual cycles, with the last period starting around mid-August.  The patient is a full-time Gaffer at [employer] and reports significant anxiety related to her health concerns.         LMP 01/15/23   Outpatient Encounter Medications as of 01/29/2023  Medication Sig   amoxicillin-clavulanate (AUGMENTIN) 875-125 MG tablet Take 1 tablet by mouth 2 (two) times daily for 7 days.   cetirizine (ZYRTEC) 10 MG tablet Take 10 mg by mouth daily.   [DISCONTINUED] cyclobenzaprine (FLEXERIL) 5 MG tablet Take 1-2 tablets (5-10 mg total) by mouth 3 (three) times daily as needed (Take for headache/migraine.).   [DISCONTINUED] fexofenadine (HM FEXOFENADINE HCL) 180 MG tablet Take 1 tablet (180 mg total) by mouth daily for 7 days.   [DISCONTINUED] naproxen (NAPROSYN) 500 MG tablet Take 1 tablet (500 mg total) by mouth 2 (two) times daily with a meal. Take for 3-4days to get rid of headache.   No facility-administered encounter medications on file as of 01/29/2023.    Past Medical History:  Diagnosis Date   Allergic conjunctivitis 11/10/2018   Allergy    seasonal   Asthma    Bursitis of intermetatarsal bursa of left foot 09/18/2018   Eczema    History of food allergy 11/10/2018   Perennial allergic rhinitis 03/21/2015   Reactive airway disease 10/29/2018   Symptom of wheezing 10/29/2018   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   Urticaria    Well child visit 03/21/2015    Past Surgical History:  Procedure Laterality Date   WISDOM TOOTH EXTRACTION  Family History  Problem Relation Age of Onset   Arthritis Mother    Asthma Mother    Allergic rhinitis Brother    Asthma Maternal Grandmother    Asthma Maternal Grandfather    Cancer Paternal Grandmother        breast   Breast cancer Paternal Grandmother 20    Social History   Socioeconomic History   Marital status: Single    Spouse name: Not on file   Number of children: Not on file   Years of  education: Not on file   Highest education level: Bachelor's degree (e.g., BA, AB, BS)  Occupational History   Not on file  Tobacco Use   Smoking status: Never   Smokeless tobacco: Never  Vaping Use   Vaping status: Never Used  Substance and Sexual Activity   Alcohol use: Not Currently    Comment: occ   Drug use: No   Sexual activity: Yes    Partners: Male    Birth control/protection: None, Condom  Other Topics Concern   Not on file  Social History Narrative   Not on file   Social Determinants of Health   Financial Resource Strain: Low Risk  (11/21/2022)   Overall Financial Resource Strain (CARDIA)    Difficulty of Paying Living Expenses: Not very hard  Food Insecurity: No Food Insecurity (11/21/2022)   Hunger Vital Sign    Worried About Running Out of Food in the Last Year: Never true    Ran Out of Food in the Last Year: Never true  Transportation Needs: No Transportation Needs (11/21/2022)   PRAPARE - Administrator, Civil Service (Medical): No    Lack of Transportation (Non-Medical): No  Physical Activity: Insufficiently Active (11/21/2022)   Exercise Vital Sign    Days of Exercise per Week: 4 days    Minutes of Exercise per Session: 30 min  Stress: Not on file  Social Connections: Moderately Integrated (11/21/2022)   Social Connection and Isolation Panel [NHANES]    Frequency of Communication with Friends and Family: More than three times a week    Frequency of Social Gatherings with Friends and Family: More than three times a week    Attends Religious Services: Never    Database administrator or Organizations: Yes    Attends Banker Meetings: 1 to 4 times per year    Marital Status: Living with partner  Intimate Partner Violence: Unknown (09/07/2021)   Received from [facility]   HITS    Physically Hurt: Not on file    Insult or Talk Down To: Not on file    Threaten Physical Harm: Not on file    Scream or Curse: Not on file    ROS All  review of systems negative except what is listed in the HPI      Objective    BP 112/71   Pulse 84   Ht 5\' 6"  (1.676 m)   Wt 141 lb (64 kg)   LMP 01/19/2023 (Exact Date)   SpO2 99%   BMI 22.76 kg/m   Physical Exam Vitals and nursing note reviewed.  Constitutional:      Appearance: Normal appearance.  HENT:     Right Ear: Tympanic membrane normal.     Left Ear: Tympanic membrane normal.     Mouth/Throat:     Mouth: Mucous membranes are moist.     Pharynx: No oropharyngeal exudate or posterior oropharyngeal erythema.     Comments: PND/cobblestoning Cardiovascular:  Rate and Rhythm: Normal rate and regular rhythm.     Heart sounds: Normal heart sounds.  Pulmonary:     Effort: Pulmonary effort is normal.     Breath sounds: Normal breath sounds.  Musculoskeletal:        General: No swelling or tenderness. Normal range of motion.     Cervical back: Normal range of motion and neck supple.  Lymphadenopathy:     Cervical: No cervical adenopathy.  Skin:    General: Skin is warm and dry.  Neurological:     Mental Status: She is alert and oriented to person, place, and time.  Psychiatric:        Mood and Affect: Mood normal.        Behavior: Behavior normal.        Thought Content: Thought content normal.        Judgment: Judgment normal.         Assessment & Plan:   Problem List Items Addressed This Visit   None Visit Diagnoses     New persistent daily headache    -  Primary   Relevant Medications   amoxicillin-clavulanate (AUGMENTIN) 875-125 MG tablet   Other Relevant Orders   Ambulatory referral to Neurology   Ambulatory referral to Ophthalmology         New Daily Persistent Headache Headaches started around July 14-17, primarily on the left side but also on the right. No nausea or photophobia. Associated with neck pain and possible cervical spine misalignment. No significant relief with Flexeril or Naproxen. -Continue physical therapy and chiropractic  care. -Referral to neurology -Referral to ophthalmology for further evaluation.  Possible Sinusitis Complaints of postnasal drainage, cough, and chest soreness. No fever. Throat examination showed significant postnasal drainage. -Start Augmentin twice a day for 7 days. -Continue prednisone 40mg  daily for 5 days. -Continue Zyrtec daily. -Consider over-the-counter cough suppressants as needed.  Possible Food Intolerance History of bloating and irregular bowel movements. Recent food allergy blood test was negative. Celiac testing earlier this month was negative.  -Consider trial of gluten and dairy elimination diet.  General Health Maintenance -Continue safe sexual practices with condom use. -Next physical exam due after Oct 07, 2023. -Follow-up if no progress with current plan or if there is a delay in specialist appointments.       Return if symptoms worsen or fail to improve, for (pending results), schedule CPE.   Clayborne Dana, NP  I spent 30 minutes dedicated to the care of this patient on the date of this encounter to include pre-visit chart review of prior notes and results, face-to-face time with the patient performing a medically appropriate exam, counseling/education, and post-visit documentation and ordering as indicated.

## 2023-01-29 NOTE — Telephone Encounter (Signed)
Spoke with patient. She states she saw [facility] and they state that she could see Ophthalmology to r/o misalignment? (Per patient) Made her aware to contact [facility] and have them send notes/referral if they think she needs Ophthalmology specifically. Patient expressed understanding.

## 2023-01-31 ENCOUNTER — Ambulatory Visit: Payer: Medicaid Other

## 2023-01-31 DIAGNOSIS — M6281 Muscle weakness (generalized): Secondary | ICD-10-CM

## 2023-01-31 DIAGNOSIS — M542 Cervicalgia: Secondary | ICD-10-CM

## 2023-01-31 DIAGNOSIS — G8929 Other chronic pain: Secondary | ICD-10-CM

## 2023-01-31 DIAGNOSIS — M25562 Pain in left knee: Secondary | ICD-10-CM | POA: Diagnosis not present

## 2023-01-31 DIAGNOSIS — R293 Abnormal posture: Secondary | ICD-10-CM

## 2023-01-31 NOTE — Therapy (Signed)
OUTPATIENT PHYSICAL THERAPY TREATMENT/RE-EVALUATION    Patient Name: Stacey Moss MRN: [id number] DOB:Apr 14, 2001, 22 y.o., female Today's Date: 01/31/2023   END OF SESSION:  PT End of Session - 01/31/23 1018     Visit Number 5    Number of Visits 6    Date for PT Re-Evaluation 03/14/23    Authorization Type [employer]    PT Start Time 1018    PT Stop Time 1058    PT Time Calculation (min) 40 min    Activity Tolerance Patient tolerated treatment well    Behavior During Therapy [facility] for tasks assessed/performed                Past Medical History:  Diagnosis Date   Allergic conjunctivitis 11/10/2018   Allergy    seasonal   Asthma    Bursitis of intermetatarsal bursa of left foot 09/18/2018   Eczema    History of food allergy 11/10/2018   Perennial allergic rhinitis 03/21/2015   Reactive airway disease 10/29/2018   Symptom of wheezing 10/29/2018   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   Urticaria    Well child visit 03/21/2015   Past Surgical History:  Procedure Laterality Date   WISDOM TOOTH EXTRACTION     Patient Active Problem List   Diagnosis Date Noted   Chronic pain of left knee 11/29/2022   Seasonal allergic rhinitis due to pollen 10/07/2022   Moderate persistent asthma 11/10/2018   Atopic dermatitis 11/10/2018    PCP: Dulce Sellar, NP  REFERRING PROVIDER:  Ralene Cork, DO - knee Dulce Sellar, NP - neck  REFERRING DIAG: Chronic pain of left knee  THERAPY DIAG:  Chronic pain of left knee  Muscle weakness (generalized)  Cervicalgia  Abnormal posture  Rationale for Evaluation and Treatment: Rehabilitation  ONSET DATE: February 2024   SUBJECTIVE:  SUBJECTIVE STATEMENT: Pt presents to PT with new referral for re-evaluation for cervical pain. Notes acute on chronic L sided neck pain that refers into L upper trap and gives occasional HA symptoms. 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 Yes:  NPRS scale: 0/10 Pain location: Left knee Pain description: Dull, aching, stabbing occasionally  Aggravating factors: Sitting, standing, walking Relieving factors: Elevating the leg and using heat  Are you having pain? Yes:  NPRS scale: 1/10 Worst: 6/10 Pain location: Left side of neck Pain description: tight, HA, deep ache Aggravating factors: sitting/teaching, nodding Relieving factors: Elevating the leg and using heat  PERTINENT HISTORY: See PMH above  PRECAUTIONS: None  PATIENT GOALS: Pain relief and return to prior level of function   OBJECTIVE:  PATIENT SURVEYS:  FOTO 44% functional status  EDEMA:  Patient does demonstrate left ankle edema compared to right  MUSCLE LENGTH: Patient demonstrates limitations of left hamstring, ITB, and calf   PALPATION: Tender to palpation lateral hamstring, calf, and posterior knee, midline quad tenderness, tenderness over lateral femoral epicondyle and ITB  Patellar mobility WFL but with pain medial glide  01/10/2023: tenderness noted L>R upper trap and bilateral suboccipital region  CERVICAL ROM:   Active ROM A/PROM (deg) eval  Flexion   Extension   Right lateral flexion   Left lateral flexion   Right rotation 62  Left rotation 65   (Blank rows = not tested)  LOWER EXTREMITY ROM:  Active ROM Right eval Left eval  Hip flexion    Hip extension  Hip abduction    Hip adduction    Hip internal rotation    Hip external rotation    Knee flexion 140 140  Knee extension 5 hyper 5 hyper  Ankle dorsiflexion 12 8  Ankle plantarflexion    Ankle inversion    Ankle eversion     (Blank rows = not tested)  Patient reports posterior knee pain with left knee hyperextension  LOWER EXTREMITY MMT:  MMT Right eval Left eval Left 01/21/23  Hip flexion     Hip extension 4 4- 4-  Hip abduction 4 4- 4+  Hip adduction     Hip internal rotation     Hip external rotation      Knee flexion 5 4+   Knee extension 5 4+   Ankle dorsiflexion     Ankle plantarflexion     Ankle inversion     Ankle eversion      (Blank rows = not tested)  FUNCTIONAL TESTS:  Squat: weight shift to the right, decreased depth, excessive forward knee translation, dynamic knee valgus  01/10/2023: DNF endurance: 13 seconds; cervical AROM grossly WFL but patient report neck tightness and cracking with cervical flexion and extension movements 01/31/2023: DNF endurance: 15 seconds   GAIT: Assistive device utilized: None Level of assistance: Complete Independence Comments: Antalgic on left   TODAY'S TREATMENT:         [facility] Adult PT Treatment:                                                DATE: 01/31/23 Therapeutic Exercise: Seated cervical SNAG rotation x 5 - 5" hold Seated cervical ext SNAG x 5 - 5" hold Supine chin tuck x 5 - 5" hold Prone Y/T x 5 each Manual Therpy: Skilled palpation and monitoring of muscle tension while performing TPDN Suboccipital release with gentle manual traction Therapeutic Activity:  Assessment of tests/measures, goals, and outcomes for new body part Trigger Point Dry Needling Treatment: Pre-treatment instruction: Patient instructed on dry needling rationale, procedures, and possible side effects including pain during treatment (achy,cramping feeling), bruising, drop of blood, lightheadedness, nausea, sweating. Patient Consent Given: No Education handout provided: No Muscles treated: left upper trap, left suboccipitals   Needle size and number: .30x75mm x 2 Electrical stimulation performed: No Parameters: N/A Treatment response/outcome: Twitch response elicited and Palpable decrease in muscle tension Post-treatment instructions: Patient instructed to expect possible mild to moderate muscle soreness later today and/or tomorrow. Patient instructed in methods to reduce muscle soreness and to continue prescribed HEP. If patient was dry needled over the lung  field, patient was instructed on signs and symptoms of pneumothorax and, however unlikely, to see immediate medical attention should they occur. Patient was also educated on signs and symptoms of infection and to seek medical attention should they occur. Patient verbalized understanding of these instructions and education.   [facility] Adult PT Treatment:                                                DATE: 01/21/23 Therapeutic Exercise: Rec Bike L2 x 5 minutes  Hip abduction x 20  Figure 4 bridge x 20 each Supine chin tuck towel roll 5 sec x 12 Supine chin tuck  with dowel pullovers x 10 Seated upper trap and levator stretches STS focusing on equal weight x 10+HEP    [facility] Adult PT Treatment:                                                DATE: 01/10/2023 Therapeutic Exercise: Recumbent bike L3 x 5 min while taking subjective Supine hamstring and ITB stretch with strap 2 x 30 sec each Prone quad stretch 2 x 30 sec Modified side plank on knees 3 x 20 sec on left Figure-4 bridge 2 x 10 each SLR 2 x 10 on left with emphasis on quad activation and knee control Rear foot elevated split squat 2 x 10 with occasional UE support Standing calf stretch at wall 2 x 30 sec each Supine passive cervical retraction 10 x 5 sec Seated passive cervical retraction 10 x 5 sec Seated upper trap and levator scap stretch 2 x 15 sec each Manual Therpy: Skilled palpation and monitoring of muscle tension while performing TPDN Suboccipital release with gentle manual traction Trigger Point Dry Needling Treatment: Pre-treatment instruction: Patient instructed on dry needling rationale, procedures, and possible side effects including pain during treatment (achy,cramping feeling), bruising, drop of blood, lightheadedness, nausea, sweating. Patient Consent Given: Yes Education handout provided: No Muscles treated: Left upper trap and bilateral suboccipitals  Needle size and number: .30x30mm x 1 and .30x18mm x 2 Electrical  stimulation performed: No Parameters: N/A Treatment response/outcome: Twitch response elicited and Palpable decrease in muscle tension Post-treatment instructions: Patient instructed to expect possible mild to moderate muscle soreness later today and/or tomorrow. Patient instructed in methods to reduce muscle soreness and to continue prescribed HEP. If patient was dry needled over the lung field, patient was instructed on signs and symptoms of pneumothorax and, however unlikely, to see immediate medical attention should they occur. Patient was also educated on signs and symptoms of infection and to seek medical attention should they occur. Patient verbalized understanding of these instructions and education.   PATIENT EDUCATION:  Education details: HEP update, TPDN, reaching out to PCP for PT referral for neck/headaches Person educated: Patient Education method: Explanation, Demonstration, Tactile cues, Verbal cues, Handout Education comprehension: verbalized understanding, returned demonstration, verbal cues required, tactile cues required, and needs further education  HOME EXERCISE PROGRAM: Access Code: [email or web address]   Access Code: [email or web address] URL: [email or web address] Date: 01/31/2023 Prepared by: Edwinna Areola  Exercises - Seated Assisted Cervical Rotation with Towel  - 1 x daily - 7 x weekly - 2 sets - 10 reps - 5 sec hold - cervical extension snag with towel  - 1 x daily - 7 x weekly - 2 sets - 10 reps - 5 sec hold - Supine Chin Tuck  - 1 x daily - 7 x weekly - 2 sets - 10 reps - 5 sec hold - Prone Scapular Retraction Y  - 1 x daily - 7 x weekly - 2 sets - 10 reps - Prone T  - 1 x daily - 7 x weekly - 2 sets - 10 reps   ASSESSMENT: CLINICAL IMPRESSION: Pt was able complete al prescribed exercises with no adverse effect. Therapy today focused on assessing cervical spine and creating HEP for chronic neck pain. FOTO score shows decrease in subjective functional ability below  PLOF for her neck at present. Also demonstrates decrease in cervical rotation ROM and DNF  strength. Pt would benefit from skilled PT services working on improving DNF and periscapular strength and manual for decreasing neck pain.    OBJECTIVE IMPAIRMENTS: Abnormal gait, decreased activity tolerance, decreased ROM, decreased strength, impaired flexibility, and pain.   ACTIVITY LIMITATIONS: lifting, bending, sitting, standing, stairs, and locomotion level  PARTICIPATION LIMITATIONS: meal prep, cleaning, driving, shopping, community activity, and occupation  PERSONAL FACTORS: Past/current experiences and Time since onset of injury/illness/exacerbation are also affecting patient's functional outcome.    GOALS: Goals reviewed with patient? Yes  SHORT TERM GOALS: Target date: 01/03/2023  Patient will be I with initial HEP in order to progress with therapy. Baseline: HEP provided at eval 01/10/2023: independent Goal status: MET  2.  Patient will report left knee pain </= 2/10 in order to reduce functional limitations Baseline: 5/10 01/10/2023: 0/10 Goal status: MET  3.  Patient will demonstrate proper squat technique to return to gym exercise and strength left knee Baseline: patient exhibits deviations with squat technique 01/10/2023: continues to exhibit squat deviations Goal status: ONGOING  LONG TERM GOALS: Target date: 03/14/2023    Patient will be I with final HEP to maintain progress from PT. Baseline: HEP provided at eval Goal status: ONGOING  2.  Patient will report >/= 69% status on FOTO to indicate improved functional ability. Baseline: 44% functional status 01/21/23: 72% Goal status: MET  3.  Patient will demonstrate left hip strength >/= 4/5 MMT and knee strength 5/5 MMT to improve standing and walking tolerance Baseline: see limitations above 01/21/23: see chart Goal status: ONGOING  4.  Patient will report no limitations with standing for work related tasks in order to  reduce functional limitations Baseline: patient reports limitations at work 01/21/23: 2-3 hours knee pain will increase (previously 45 minutes )  Goal status: ONGOING, improving  5.  Pt will improve FOTO (neck) function score to no less than 71% as proxy for functional improvement  Baseline: 59% function Goal status: INITIAL  6.  Pt will improve bilateral cervical rotation to no less than 80 degrees for improved comfort with driving and school activities  Baseline: see ROM chart Goal status: INITIAL   PLAN: PT FREQUENCY: 1x/week  PT DURATION: 6 weeks  PLANNED INTERVENTIONS: Therapeutic exercises, Therapeutic activity, Neuromuscular re-education, Balance training, Gait training, Patient/Family education, Self Care, Joint mobilization, Aquatic Therapy, Dry Needling, Electrical stimulation, Cryotherapy, Moist heat, Taping, Ionotophoresis 4mg /ml Dexamethasone, Manual therapy, and Re-evaluation  PLAN FOR NEXT SESSION: EVAL neck/ re-eval knee.  Review HEP and progress PRN, manual/TPDN for left hamstring/calf/quad, progress stretching and strengthening for the left hip and knee, squat mechanics   Eloy End PT  01/31/23 11:12 AM

## 2023-02-05 ENCOUNTER — Encounter: Payer: Self-pay | Admitting: Family Medicine

## 2023-02-06 ENCOUNTER — Ambulatory Visit: Payer: Medicaid Other

## 2023-02-06 ENCOUNTER — Encounter: Payer: Self-pay | Admitting: Family Medicine

## 2023-02-06 ENCOUNTER — Ambulatory Visit (INDEPENDENT_AMBULATORY_CARE_PROVIDER_SITE_OTHER): Payer: Medicaid Other | Admitting: Family Medicine

## 2023-02-06 VITALS — BP 137/80 | HR 98 | Resp 18 | Ht 66.0 in | Wt 143.2 lb

## 2023-02-06 DIAGNOSIS — F419 Anxiety disorder, unspecified: Secondary | ICD-10-CM

## 2023-02-06 DIAGNOSIS — R21 Rash and other nonspecific skin eruption: Secondary | ICD-10-CM

## 2023-02-06 NOTE — Patient Instructions (Addendum)
Add Pepcid twice daily for a week or so Continue Zyrtec daily   Please contact office for follow-up if symptoms do not improve or worsen. Seek emergency care if symptoms become severe.

## 2023-02-06 NOTE — Progress Notes (Signed)
Acute Office Visit  Subjective:     Patient ID: Stacey Moss, female    DOB: 20-Jul-2000, 22 y.o.   MRN: [id number]  Chief Complaint  Patient presents with   Rash    Onset 02/05/2023 pt states they popped up after taking Augmentin  Rash is on legs and arms  No itching, or burning  No changes in soaps at home      Patient is in today for rash.  Discussed the use of AI scribe software for clinical note transcription with the patient, who gave verbal consent to proceed.  History of Present Illness   The patient, with no known history of penicillin allergy, developed a rash following completion of a course of Augmentin. The rash initially appeared on the upper leg and subsequently spread to the arms and legs. The rash is neither itchy nor painful, with the exception of one spot which the patient attributes to a possible bug bite. The patient has been managing the rash with Zyrtec, an antihistamine they take regularly.  In addition to the rash, the patient reports a residual post-nasal drip following a recent cough, but is unsure if this is related to their usual allergies. The patient also expresses increased health anxiety, which they believe may be related to the recent passing of their grandfather due to health complications. They have attempted to seek therapy for this anxiety but have encountered difficulties in scheduling.              All review of systems negative except what is listed in the HPI      Objective:    BP 137/80 (BP Location: Left Arm, Patient Position: Sitting, Cuff Size: Normal)   Pulse 98   Resp 18   Ht 5\' 6"  (1.676 m)   Wt 143 lb 3.2 oz (65 kg)   LMP 01/19/2023 (Exact Date)   SpO2 100%   BMI 23.11 kg/m    Physical Exam Vitals reviewed.  Constitutional:      Appearance: Normal appearance.  Cardiovascular:     Rate and Rhythm: Normal rate and regular rhythm.     Heart sounds: Normal heart sounds.  Pulmonary:     Effort: Pulmonary effort  is normal.     Breath sounds: Normal breath sounds. No wheezing, rhonchi or rales.  Skin:    General: Skin is warm and dry.     Comments: Mild erythematous maculopapular rash to bilateral arms and legs  Neurological:     Mental Status: She is alert and oriented to person, place, and time.  Psychiatric:        Mood and Affect: Mood normal.        Behavior: Behavior normal.        Thought Content: Thought content normal.        Judgment: Judgment normal.     Comments: Tearful, anxious     No results found for any visits on 02/06/23.      Assessment & Plan:   Problem List Items Addressed This Visit   None Visit Diagnoses     Anxiety    -  Primary Referral to [facility] for counseling/meds If you decide you want to talk about starting medication before they can get you in, let me know.     Relevant Orders   Ambulatory referral to Behavioral Health   Rash and nonspecific skin eruption     Add Pepcid twice daily for a week or so Continue Zyrtec daily Monitor for new or  worsening symptoms         No orders of the defined types were placed in this encounter.   Return if symptoms worsen or fail to improve.  Clayborne Dana, NP

## 2023-02-11 ENCOUNTER — Ambulatory Visit (INDEPENDENT_AMBULATORY_CARE_PROVIDER_SITE_OTHER): Payer: Medicaid Other | Admitting: Radiology

## 2023-02-11 ENCOUNTER — Encounter: Payer: Self-pay | Admitting: Radiology

## 2023-02-11 ENCOUNTER — Other Ambulatory Visit (HOSPITAL_COMMUNITY)
Admission: RE | Admit: 2023-02-11 | Discharge: 2023-02-11 | Disposition: A | Discharge status: Home / Self Care | Payer: Medicaid Other | Source: Ambulatory Visit | Attending: Radiology | Admitting: Radiology

## 2023-02-11 VITALS — BP 138/70 | HR 90 | Resp 12 | Ht 64.0 in | Wt 143.0 lb

## 2023-02-11 DIAGNOSIS — Z113 Encounter for screening for infections with a predominantly sexual mode of transmission: Secondary | ICD-10-CM

## 2023-02-11 DIAGNOSIS — Z01419 Encounter for gynecological examination (general) (routine) without abnormal findings: Secondary | ICD-10-CM | POA: Insufficient documentation

## 2023-02-11 DIAGNOSIS — Z30018 Encounter for initial prescription of other contraceptives: Secondary | ICD-10-CM

## 2023-02-11 MED ORDER — PHEXXI 1.8-1-0.4 % VA GEL: 5.0000 g | VAGINAL | 11 refills | Status: AC

## 2023-02-11 NOTE — Progress Notes (Signed)
   Stacey Moss Aug 26, 2000 [id number]   History:  22 y.o. G0 presents for annual exam. No gyn concerns. Hx of ovarian cysts in adolescence, no problems since. Using condoms for [facility]. Did not like how hormonal BC changed her moods. Has a PCP.   Gynecologic History Patient's last menstrual period was 01/19/2023 (exact date). Period Cycle (Days): 28 Period Duration (Days): 4-5 Period Pattern: Regular Menstrual Flow: Moderate, Heavy Menstrual Control: Tampon, Maxi pad Menstrual Control Change Freq (Hours): changes pad/tampon every 4 hours Dysmenorrhea: (!) Severe Dysmenorrhea Symptoms: Cramping Contraception/Family planning: condoms Sexually active: yes   Obstetric History OB History  Gravida Para Term Preterm AB Living  0 0 0 0 0 0  SAB IAB Ectopic Multiple Live Births  0 0 0 0 0     The following portions of the patient's history were reviewed and updated as appropriate: allergies, current medications, past family history, past medical history, past social history, past surgical history, and problem list.  Review of Systems Pertinent items noted in HPI and remainder of comprehensive ROS otherwise negative.   Past medical history, past surgical history, family history and social history were all reviewed and documented in the EPIC chart.   Exam:  Vitals:   02/11/23 0840  BP: 138/70  Pulse: 90  Resp: 12  Weight: 143 lb (64.9 kg)  Height: 5\' 4"  (1.626 m)   Body mass index is 24.55 kg/m.  General appearance:  Normal Thyroid:  Symmetrical, normal in size, without palpable masses or nodularity. Respiratory  Auscultation:  Clear without wheezing or rhonchi Cardiovascular  Auscultation:  Regular rate, without rubs, murmurs or gallops  Edema/varicosities:  Not grossly evident Abdominal  Soft,nontender, without masses, guarding or rebound.  Liver/spleen:  No organomegaly noted  Hernia:  None appreciated  Skin  Inspection:  Grossly normal Breasts: Examined lying and  sitting.   Right: Without masses, retractions, nipple discharge or axillary adenopathy.   Left: Without masses, retractions, nipple discharge or axillary adenopathy. Genitourinary   Inguinal/mons:  Normal without inguinal adenopathy  External genitalia:  Normal appearing vulva with no masses, tenderness, or lesions  BUS/Urethra/Skene's glands:  Normal without masses or exudate  Vagina:  Normal appearing with normal color and discharge, no lesions  Cervix:  Normal appearing without discharge or lesions  Uterus:  Normal in size, shape and contour.  Mobile, nontender  Adnexa/parametria:     Rt: Normal in size, without masses or tenderness.   Lt: Normal in size, without masses or tenderness.  Anus and perineum: Normal  Arnold Long, RN present for exam  Assessment/Plan:   1. Well woman exam with routine gynecological exam - Cytology - PAP( [place])  2. Screening for STDs (sexually transmitted diseases) - Cytology - PAP( [facility])  3. Encounter for initial prescription of other contraceptives - Lactic Ac-Citric Ac-Pot Bitart (PHEXXI) 1.8-1-0.4 % GEL; Place 5 g vaginally as directed. Before intercourse  Dispense: 120 g; Refill: 11     Discussed SBE, pap and STI screening as directed/appropriate. Recommend of exercise weekly, including weight bearing exercise. Encouraged the use of seatbelts and sunscreen. Return in 1 year for annual or as needed.   [name] B WHNP-BC 9:00 AM 02/11/2023

## 2023-02-12 LAB — CYTOLOGY - PAP
Chlamydia: NEGATIVE
Comment: NEGATIVE
Comment: NEGATIVE
Comment: NORMAL
Diagnosis: NEGATIVE
Neisseria Gonorrhea: NEGATIVE
Trichomonas: NEGATIVE

## 2023-02-14 ENCOUNTER — Ambulatory Visit: Payer: Medicaid Other | Admitting: Physical Therapy

## 2023-02-20 ENCOUNTER — Encounter: Payer: Self-pay | Admitting: Family Medicine

## 2023-02-20 ENCOUNTER — Ambulatory Visit (INDEPENDENT_AMBULATORY_CARE_PROVIDER_SITE_OTHER): Payer: Medicaid Other | Admitting: Family Medicine

## 2023-02-20 ENCOUNTER — Other Ambulatory Visit (HOSPITAL_BASED_OUTPATIENT_CLINIC_OR_DEPARTMENT_OTHER): Payer: Self-pay

## 2023-02-20 VITALS — BP 121/68 | HR 98 | Ht 64.0 in | Wt 144.0 lb

## 2023-02-20 DIAGNOSIS — M79602 Pain in left arm: Secondary | ICD-10-CM

## 2023-02-20 MED ORDER — NAPROXEN 500 MG PO TABS
500.0000 mg | ORAL_TABLET | Freq: Two times a day (BID) | ORAL | 2 refills | Status: DC
Start: 2023-02-20 — End: 2023-04-16
  Filled 2023-02-20: qty 60, 30d supply, fill #0

## 2023-02-20 NOTE — Progress Notes (Signed)
Acute Office Visit  Subjective:     Patient ID: Stacey Moss, female    DOB: 05-19-2001, 22 y.o.   MRN: [id number]  Chief Complaint  Patient presents with   Arm Pain     Patient is in today for left arm pain.   Discussed the use of AI scribe software for clinical note transcription with the patient, who gave verbal consent to proceed.  History of Present Illness   The patient presents with a new complaint of left inner arm discomfort. The discomfort has been noticed intermittently over the past two weeks, but has become more consistent over the last three to four days. The discomfort is described as a throbbing or aching pain, primarily located in the upper/mid arm, and occasionally radiates down to the fingertips, usually 4th and 5th fingers.   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 There is no recent history of trauma or injury to the arm.   The patient has been undergoing physical therapy for issues related to the neck and leg. The patient reports that the neck issues are primarily on the left side, and wonders if the current arm discomfort could be related to these issues. The patient also mentions a family history of arthritis and expresses concern about the potential for developing this condition.            ROS All review of systems negative except what is listed in the HPI      Objective:    BP 121/68   Pulse 98   Ht 5\' 4"  (1.626 m)   Wt 144 lb (65.3 kg)   SpO2 100%   BMI 24.72 kg/m    Physical Exam Vitals reviewed.  Constitutional:      Appearance: Normal appearance.  Cardiovascular:     Rate and Rhythm: Normal rate and regular rhythm.  Pulmonary:     Effort: Pulmonary effort is normal.  Musculoskeletal:        General: Normal range of motion.     Comments: Discomfort reproduced with pressure applied over ulnar nerve above medial epicondyle region  Skin:    General: Skin is warm and dry.      Findings: No bruising, erythema or rash.  Neurological:     Mental Status: She is alert and oriented to person, place, and time.  Psychiatric:        Mood and Affect: Mood normal.        Behavior: Behavior normal.     No results found for any visits on 02/20/23.      Assessment & Plan:   Problem List Items Addressed This Visit   None Visit Diagnoses     Left arm pain    -  Primary   Relevant Medications   naproxen (NAPROSYN) 500 MG tablet     Rest, ice, heat, stretching (ulnar nerve glides), continue PT/chiropractic therapy, adding naproxen Follow-up if not improving     Meds ordered this encounter  Medications   naproxen (NAPROSYN) 500 MG tablet    Sig: Take 1 tablet (500 mg total) by mouth 2 (two) times daily with a meal.    Dispense:  60 tablet    Refill:  2    Order Specific Question:   Supervising Provider    Answer:   Danise Edge A [[id number]]    Return if symptoms worsen or fail to improve.  Clayborne Dana, NP

## 2023-02-20 NOTE — Patient Instructions (Addendum)
Rest, ice, heat, stretching (ulnar nerve glides), continue PT/chiropractic therapy, adding naproxen Follow-up if not improving

## 2023-02-21 ENCOUNTER — Other Ambulatory Visit: Payer: Self-pay

## 2023-02-21 ENCOUNTER — Encounter: Payer: Self-pay | Admitting: Physical Therapy

## 2023-02-21 ENCOUNTER — Ambulatory Visit: Payer: Medicaid Other | Attending: Sports Medicine | Admitting: Physical Therapy

## 2023-02-21 DIAGNOSIS — M25562 Pain in left knee: Secondary | ICD-10-CM | POA: Insufficient documentation

## 2023-02-21 DIAGNOSIS — M542 Cervicalgia: Secondary | ICD-10-CM | POA: Diagnosis present

## 2023-02-21 DIAGNOSIS — G8929 Other chronic pain: Secondary | ICD-10-CM | POA: Insufficient documentation

## 2023-02-21 DIAGNOSIS — R293 Abnormal posture: Secondary | ICD-10-CM | POA: Diagnosis present

## 2023-02-21 DIAGNOSIS — M6281 Muscle weakness (generalized): Secondary | ICD-10-CM | POA: Diagnosis present

## 2023-02-21 NOTE — Patient Instructions (Signed)
Access Code: [email or web address] URL: [email or web address] Date: 02/21/2023 Prepared by: Rosana Hoes  Exercises - Supine Hamstring Stretch with Strap  - 1-2 x daily - 3 reps - 30 seconds hold - Supine ITB Stretch with Strap  - 1-2 x daily - 3 reps - 30 seconds hold - Long Sitting Calf Stretch with Strap  - 1-2 x daily - 3 reps - 30 seconds hold - Calf Mobilization with Small Ball  - 1-2 x daily - 7 x weekly - 3 sets - 10 reps - Active Straight Leg Raise with Quad Set  - 1 x daily - 3 sets - 10 reps - Clam with Resistance  - 1 x daily - 3 sets - 10 reps - Bridge  - 1 x daily - 3 sets - 10 reps - 5 seconds hold - Sit to stand with control  - 1 x daily - 3 sets - 10 reps - Supine Passive Cervical Retraction  - 2-3 x daily - 10 reps - 5 seconds hold - Seated Passive Cervical Retraction  - 2-3 x daily - 10 reps - 5 seconds hold - Seated Cervical Sidebending Stretch  - 2-3 x daily - 3 reps - 15 seconds hold - Seated Levator Scapulae Stretch  - 2-3 x daily - 3 reps - 15 seconds hold - Shoulder External Rotation and Scapular Retraction with Resistance  - 1 x daily - 3 sets - 10 reps

## 2023-02-21 NOTE — Therapy (Signed)
OUTPATIENT PHYSICAL THERAPY TREATMENT/RE-EVALUATION    Patient Name: Stacey Moss MRN: [id number] DOB:April 04, 2001, 22 y.o., female Today's Date: 02/21/2023   END OF SESSION:  PT End of Session - 02/21/23 0855     Visit Number 6    Number of Visits 11    Date for PT Re-Evaluation 03/14/23    Authorization Type [employer]    Authorization - Number of Visits 27    PT Start Time 0852    PT Stop Time 0933    PT Time Calculation (min) 41 min    Activity Tolerance Patient tolerated treatment well    Behavior During Therapy [facility] for tasks assessed/performed                Past Medical History:  Diagnosis Date   Allergic conjunctivitis 11/10/2018   Allergy    seasonal   Asthma    Bursitis of intermetatarsal bursa of left foot 09/18/2018   Eczema    History of food allergy 11/10/2018   Perennial allergic rhinitis 03/21/2015   Reactive airway disease 10/29/2018   Symptom of wheezing 10/29/2018   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   Urticaria    Well child visit 03/21/2015   Past Surgical History:  Procedure Laterality Date   WISDOM TOOTH EXTRACTION     Patient Active Problem List   Diagnosis Date Noted   Chronic pain of left knee 11/29/2022   Seasonal allergic rhinitis due to pollen 10/07/2022   Moderate persistent asthma 11/10/2018   Atopic dermatitis 11/10/2018    PCP: Dulce Sellar, NP  REFERRING PROVIDER:  Ralene Cork, DO - knee Dulce Sellar, NP - neck  REFERRING DIAG: Chronic pain of left knee  THERAPY DIAG:  Cervicalgia  Chronic pain of left knee  Muscle weakness (generalized)  Abnormal posture  Rationale for Evaluation and Treatment: Rehabilitation  ONSET DATE: February 2024   SUBJECTIVE:  SUBJECTIVE STATEMENT: Patient reports that her knee has been doing good except for the past week or so because she has been running around a lot, and the neck has been doing pretty good but will occasionally hurt from working at [employer]. She did  also report a weird pain in her left arm where it would start on the inside of the upper arm and travel down to her pinky. She denies any recent headaches.  PAIN:  Are you having pain? Yes:  NPRS scale: 0/10 Pain location: Left knee Pain description: Dull, aching, stabbing occasionally  Aggravating factors: Sitting, standing, walking Relieving factors: Elevating the leg and using heat  Are you having pain? Yes:  NPRS scale: 0/10 Worst: 6/10 Pain location: Left side of neck Pain description: Tight, HA, deep ache Aggravating factors: sitting/teaching, nodding Relieving factors: Elevating the leg and using heat  PERTINENT HISTORY: See PMH above  PRECAUTIONS: None  PATIENT GOALS: Pain relief and return to prior level of function   OBJECTIVE:  PATIENT SURVEYS:  FOTO 44% functional status  01/21/23: 72%  EDEMA:  Patient does demonstrate left ankle edema compared to right  MUSCLE LENGTH: Patient demonstrates limitations of left hamstring, ITB, and calf   PALPATION: Tender to palpation lateral hamstring, calf, and posterior knee, midline quad tenderness, tenderness over lateral femoral epicondyle and ITB  Patellar mobility WFL but with pain medial glide  01/10/2023: tenderness noted L>R upper trap and bilateral suboccipital region  CERVICAL ROM:   Active ROM A/PROM (deg) eval  Flexion   Extension   Right lateral flexion   Left lateral flexion  Right rotation 62  Left rotation 65   (Blank rows = not tested)  LOWER EXTREMITY ROM:  Active ROM Right eval Left eval  Hip flexion    Hip extension    Hip abduction    Hip adduction    Hip internal rotation    Hip external rotation    Knee flexion 140 140  Knee extension 5 hyper 5 hyper  Ankle dorsiflexion 12 8  Ankle plantarflexion    Ankle inversion    Ankle eversion     (Blank rows = not tested)  Patient reports posterior knee pain with left knee hyperextension  LOWER EXTREMITY MMT:  MMT Right eval  Left eval Left 01/21/23  Hip flexion     Hip extension 4 4- 4-  Hip abduction 4 4- 4+  Hip adduction     Hip internal rotation     Hip external rotation     Knee flexion 5 4+   Knee extension 5 4+   Ankle dorsiflexion     Ankle plantarflexion     Ankle inversion     Ankle eversion      (Blank rows = not tested)  UPPER EXTREMITY MMT:  MMT Right 02/21/2023 Left 02/21/2023  Shoulder flexion    Shoulder extension    Shoulder abduction    Shoulder adduction    Shoulder extension    Shoulder internal rotation    Shoulder external rotation 5 4+  Middle trapezius 4 4-  Lower trapezius 4 4-  Elbow flexion    Elbow extension    Wrist flexion    Wrist extension    Wrist ulnar deviation    Wrist radial deviation    Wrist pronation    Wrist supination    Grip strength    (Blank rows = not tested)   FUNCTIONAL TESTS:  Squat: weight shift to the right, decreased depth, excessive forward knee translation, dynamic knee valgus  01/10/2023: DNF endurance: 13 seconds; cervical AROM grossly WFL but patient report neck tightness and cracking with cervical flexion and extension movements 01/31/2023: DNF endurance: 15 seconds   GAIT: Assistive device utilized: None Level of assistance: Complete Independence Comments: Antalgic on left   TODAY'S TREATMENT:         [facility] Adult PT Treatment:                                                DATE: 02/21/23 Therapeutic Exercise: UBE L1 x 4 min (fwd/bwd) while taking subjective Supine chin tuck 10 x 5 sec Prone I palm down 10 x 5 sec Prone T thumb up 10 x 5 sec Prone Y thumb up 10 x 5 sec Prone W palm down 10 x 5 sec Seated double ER and scap retraction with blue 2 x 10 Manual Therpy: Skilled palpation and monitoring of muscle tension while performing TPDN STP / TPDN left upper trap and posterior cuff region Trigger Point Dry Needling Treatment: Pre-treatment instruction: Patient instructed on dry needling rationale, procedures, and  possible side effects including pain during treatment (achy,cramping feeling), bruising, drop of blood, lightheadedness, nausea, sweating. Patient Consent Given: No Education handout provided: No Muscles treated: Left upper trap and infraspinatus Needle size and number: .30x21mm x 4 Electrical stimulation performed: No Parameters: N/A Treatment response/outcome: Twitch response elicited and Palpable decrease in muscle tension Post-treatment instructions: Patient instructed to expect possible  mild to moderate muscle soreness later today and/or tomorrow. Patient instructed in methods to reduce muscle soreness and to continue prescribed HEP. If patient was dry needled over the lung field, patient was instructed on signs and symptoms of pneumothorax and, however unlikely, to see immediate medical attention should they occur. Patient was also educated on signs and symptoms of infection and to seek medical attention should they occur. Patient verbalized understanding of these instructions and education.    [facility] Adult PT Treatment:                                                DATE: 01/31/23 Therapeutic Exercise: Seated cervical SNAG rotation x 5 - 5" hold Seated cervical ext SNAG x 5 - 5" hold Supine chin tuck x 5 - 5" hold Prone Y/T x 5 each Manual Therpy: Skilled palpation and monitoring of muscle tension while performing TPDN Suboccipital release with gentle manual traction Therapeutic Activity:  Assessment of tests/measures, goals, and outcomes for new body part Trigger Point Dry Needling Treatment: Pre-treatment instruction: Patient instructed on dry needling rationale, procedures, and possible side effects including pain during treatment (achy,cramping feeling), bruising, drop of blood, lightheadedness, nausea, sweating. Patient Consent Given: No Education handout provided: No Muscles treated: left upper trap, left suboccipitals   Needle size and number: .30x28mm x 2 Electrical  stimulation performed: No Parameters: N/A Treatment response/outcome: Twitch response elicited and Palpable decrease in muscle tension Post-treatment instructions: Patient instructed to expect possible mild to moderate muscle soreness later today and/or tomorrow. Patient instructed in methods to reduce muscle soreness and to continue prescribed HEP. If patient was dry needled over the lung field, patient was instructed on signs and symptoms of pneumothorax and, however unlikely, to see immediate medical attention should they occur. Patient was also educated on signs and symptoms of infection and to seek medical attention should they occur. Patient verbalized understanding of these instructions and education.   [facility] Adult PT Treatment:                                                DATE: 01/21/23 Therapeutic Exercise: Rec Bike L2 x 5 minutes  Hip abduction x 20  Figure 4 bridge x 20 each Supine chin tuck towel roll 5 sec x 12 Supine chin tuck with dowel pullovers x 10 Seated upper trap and levator stretches STS focusing on equal weight x 10+HEP   PATIENT EDUCATION:  Education details: HEP update, TPDN Person educated: Patient Education method: Explanation, Demonstration, Tactile cues, Verbal cues, Handout Education comprehension: verbalized understanding, returned demonstration, verbal cues required, tactile cues required, and needs further education  HOME EXERCISE PROGRAM: Access Code: [email or web address]    ASSESSMENT: CLINICAL IMPRESSION: Patient tolerated therapy well with no adverse effects. Continued with TPDN for the left upper trap and infraspinatus with patient reporting concordant left arm pain with palpation to the left posterior cuff. Her left arm symptoms due to seem to have an ulnar nerve distribution but she did not report any increase in pain with exercises this visit. Therapy focused on progressing periscapular and rotator cuff strengthening with good tolerance. She does demonstrate  left periscapular weakness compared to the right and requires cueing to avoid shrug compensation on the  left. Updated her HEP to progress strengthening for home. Patient would benefit from continued skilled PT to progress her mobility and strength in order to reduce pain and maximize functional ability.    OBJECTIVE IMPAIRMENTS: Abnormal gait, decreased activity tolerance, decreased ROM, decreased strength, impaired flexibility, and pain.   ACTIVITY LIMITATIONS: lifting, bending, sitting, standing, stairs, and locomotion level  PARTICIPATION LIMITATIONS: meal prep, cleaning, driving, shopping, community activity, and occupation  PERSONAL FACTORS: Past/current experiences and Time since onset of injury/illness/exacerbation are also affecting patient's functional outcome.    GOALS: Goals reviewed with patient? Yes  SHORT TERM GOALS: Target date: 01/03/2023  Patient will be I with initial HEP in order to progress with therapy. Baseline: HEP provided at eval 01/10/2023: independent Goal status: MET  2.  Patient will report left knee pain </= 2/10 in order to reduce functional limitations Baseline: 5/10 01/10/2023: 0/10 Goal status: MET  3.  Patient will demonstrate proper squat technique to return to gym exercise and strength left knee Baseline: patient exhibits deviations with squat technique 01/10/2023: continues to exhibit squat deviations Goal status: ONGOING  LONG TERM GOALS: Target date: 03/14/2023  Patient will be I with final HEP to maintain progress from PT. Baseline: HEP provided at eval Goal status: ONGOING  2.  Patient will report >/= 69% status on FOTO to indicate improved functional ability. Baseline: 44% functional status 01/21/23: 72% Goal status: MET  3.  Patient will demonstrate left hip strength >/= 4/5 MMT and knee strength 5/5 MMT to improve standing and walking tolerance Baseline: see limitations above 01/21/23: see chart Goal status: ONGOING  4.  Patient will  report no limitations with standing for work related tasks in order to reduce functional limitations Baseline: patient reports limitations at work 01/21/23: 2-3 hours knee pain will increase (previously 45 minutes )  Goal status: ONGOING, improving  5.  Pt will improve FOTO (neck) function score to no less than 71% as proxy for functional improvement  Baseline: 59% function Goal status: INITIAL  6.  Pt will improve bilateral cervical rotation to no less than 80 degrees for improved comfort with driving and school activities  Baseline: see ROM chart Goal status: INITIAL   PLAN: PT FREQUENCY: 1x/week  PT DURATION: 6 weeks  PLANNED INTERVENTIONS: Therapeutic exercises, Therapeutic activity, Neuromuscular re-education, Balance training, Gait training, Patient/Family education, Self Care, Joint mobilization, Aquatic Therapy, Dry Needling, Electrical stimulation, Cryotherapy, Moist heat, Taping, Ionotophoresis 4mg /ml Dexamethasone, Manual therapy, and Re-evaluation  PLAN FOR NEXT SESSION: Review HEP and progress PRN, manual/TPDN for left neck and shoulder region, periscapular and rotator cuff strengthening, ulnar nerve glides, progress stretching and strengthening for the left hip and knee, squat mechanics   Rosana Hoes, PT, DPT, LAT, ATC 02/21/23  9:50 AM Phone: [phone] Fax: [phone]

## 2023-02-24 ENCOUNTER — Ambulatory Visit: Payer: Medicaid Other

## 2023-02-24 DIAGNOSIS — M542 Cervicalgia: Secondary | ICD-10-CM

## 2023-02-24 DIAGNOSIS — G8929 Other chronic pain: Secondary | ICD-10-CM

## 2023-02-24 DIAGNOSIS — R293 Abnormal posture: Secondary | ICD-10-CM

## 2023-02-24 DIAGNOSIS — M6281 Muscle weakness (generalized): Secondary | ICD-10-CM

## 2023-02-24 NOTE — Therapy (Signed)
OUTPATIENT PHYSICAL THERAPY TREATMENT   Patient Name: Stacey Moss MRN: [id number] DOB:05/03/2001, 22 y.o., female Today's Date: 02/24/2023   END OF SESSION:  PT End of Session - 02/24/23 1407     Visit Number 7    Number of Visits 11    Date for PT Re-Evaluation 03/14/23    Authorization Type [employer]    Authorization - Number of Visits 27    PT Start Time 1407    PT Stop Time 1445    PT Time Calculation (min) 38 min    Activity Tolerance Patient tolerated treatment well    Behavior During Therapy [facility] for tasks assessed/performed                 Past Medical History:  Diagnosis Date   Allergic conjunctivitis 11/10/2018   Allergy    seasonal   Asthma    Bursitis of intermetatarsal bursa of left foot 09/18/2018   Eczema    History of food allergy 11/10/2018   Perennial allergic rhinitis 03/21/2015   Reactive airway disease 10/29/2018   Symptom of wheezing 10/29/2018   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   Urticaria    Well child visit 03/21/2015   Past Surgical History:  Procedure Laterality Date   WISDOM TOOTH EXTRACTION     Patient Active Problem List   Diagnosis Date Noted   Chronic pain of left knee 11/29/2022   Seasonal allergic rhinitis due to pollen 10/07/2022   Moderate persistent asthma 11/10/2018   Atopic dermatitis 11/10/2018    PCP: Dulce Sellar, NP  REFERRING PROVIDER:  Ralene Cork, DO - knee Dulce Sellar, NP - neck  REFERRING DIAG: Chronic pain of left knee  THERAPY DIAG:  Cervicalgia  Chronic pain of left knee  Muscle weakness (generalized)  Abnormal posture  Rationale for Evaluation and Treatment: Rehabilitation  ONSET DATE: February 2024   SUBJECTIVE:  SUBJECTIVE STATEMENT: Pt presents to PT with reports of slight neck and L upper trap pain. Was a 4/10 earlier this morning.   PAIN:  Are you having pain? Yes:  NPRS scale: 0/10 Pain location: Left knee Pain description: Dull, aching, stabbing occasionally   Aggravating factors: Sitting, standing, walking Relieving factors: Elevating the leg and using heat  Are you having pain? Yes:  NPRS scale: 0/10 Worst: 6/10 Pain location: Left side of neck Pain description: Tight, HA, deep ache Aggravating factors: sitting/teaching, nodding Relieving factors: Elevating the leg and using heat  PERTINENT HISTORY: See PMH above  PRECAUTIONS: None  PATIENT GOALS: Pain relief and return to prior level of function   OBJECTIVE:  PATIENT SURVEYS:  FOTO 44% functional status  01/21/23: 72%  EDEMA:  Patient does demonstrate left ankle edema compared to right  MUSCLE LENGTH: Patient demonstrates limitations of left hamstring, ITB, and calf   PALPATION: Tender to palpation lateral hamstring, calf, and posterior knee, midline quad tenderness, tenderness over lateral femoral epicondyle and ITB  Patellar mobility WFL but with pain medial glide  01/10/2023: tenderness noted L>R upper trap and bilateral suboccipital region  CERVICAL ROM:   Active ROM A/PROM (deg) eval  Flexion   Extension   Right lateral flexion   Left lateral flexion   Right rotation 62  Left rotation 65   (Blank rows = not tested)  LOWER EXTREMITY ROM:  Active ROM Right eval Left eval  Hip flexion    Hip extension    Hip abduction    Hip adduction    Hip internal rotation    Hip  external rotation    Knee flexion 140 140  Knee extension 5 hyper 5 hyper  Ankle dorsiflexion 12 8  Ankle plantarflexion    Ankle inversion    Ankle eversion     (Blank rows = not tested)  Patient reports posterior knee pain with left knee hyperextension  LOWER EXTREMITY MMT:  MMT Right eval Left eval Left 01/21/23  Hip flexion     Hip extension 4 4- 4-  Hip abduction 4 4- 4+  Hip adduction     Hip internal rotation     Hip external rotation     Knee flexion 5 4+   Knee extension 5 4+   Ankle dorsiflexion     Ankle plantarflexion     Ankle inversion     Ankle eversion       (Blank rows = not tested)  UPPER EXTREMITY MMT:  MMT Right 02/21/2023 Left 02/21/2023  Shoulder flexion    Shoulder extension    Shoulder abduction    Shoulder adduction    Shoulder extension    Shoulder internal rotation    Shoulder external rotation 5 4+  Middle trapezius 4 4-  Lower trapezius 4 4-  Elbow flexion    Elbow extension    Wrist flexion    Wrist extension    Wrist ulnar deviation    Wrist radial deviation    Wrist pronation    Wrist supination    Grip strength    (Blank rows = not tested)   FUNCTIONAL TESTS:  Squat: weight shift to the right, decreased depth, excessive forward knee translation, dynamic knee valgus  01/10/2023: DNF endurance: 13 seconds; cervical AROM grossly WFL but patient report neck tightness and cracking with cervical flexion and extension movements 01/31/2023: DNF endurance: 15 seconds   GAIT: Assistive device utilized: None Level of assistance: Complete Independence Comments: Antalgic on left   TODAY'S TREATMENT:         [facility] Adult PT Treatment:                                                DATE: 02/24/23 Therapeutic Exercise: UBE L1 x 4 min (fwd/bwd) while taking subjective Supine chin tuck 10 x 5 sec Supine serratus raise 2x10 YTB Prone T thumb up 2x10 - 5 sec Prone Y thumb up 2x10 - 5 sec Prone W palm down with ext 2x10 - 5 sec Supine horizontal abd 2x15 blue band Seated double ER and scap retraction with blue x 15 Manual Therpy: Skilled palpation and monitoring of muscle tension while performing TPDN STP / TPDN left upper trap and posterior cuff region Suboccipital release Positional release left upper trap Trigger Point Dry Needling Treatment: Pre-treatment instruction: Patient instructed on dry needling rationale, procedures, and possible side effects including pain during treatment (achy,cramping feeling), bruising, drop of blood, lightheadedness, nausea, sweating. Patient Consent Given: No Education handout  provided: No Muscles treated: Left upper trap Needle size and number: .30x36mm x 2 Electrical stimulation performed: No Parameters: N/A Treatment response/outcome: Twitch response elicited and Palpable decrease in muscle tension Post-treatment instructions: Patient instructed to expect possible mild to moderate muscle soreness later today and/or tomorrow. Patient instructed in methods to reduce muscle soreness and to continue prescribed HEP. If patient was dry needled over the lung field, patient was instructed on signs and symptoms of pneumothorax and, however unlikely, to  see immediate medical attention should they occur. Patient was also educated on signs and symptoms of infection and to seek medical attention should they occur. Patient verbalized understanding of these instructions and education.   [facility] Adult PT Treatment:                                                DATE: 02/21/23 Therapeutic Exercise: UBE L1 x 4 min (fwd/bwd) while taking subjective Supine chin tuck 10 x 5 sec Prone I palm down 10 x 5 sec Prone T thumb up 10 x 5 sec Prone Y thumb up 10 x 5 sec Prone W palm down 10 x 5 sec Seated double ER and scap retraction with blue 2 x 10 Manual Therpy: Skilled palpation and monitoring of muscle tension while performing TPDN STP / TPDN left upper trap and posterior cuff region Trigger Point Dry Needling Treatment: Pre-treatment instruction: Patient instructed on dry needling rationale, procedures, and possible side effects including pain during treatment (achy,cramping feeling), bruising, drop of blood, lightheadedness, nausea, sweating. Patient Consent Given: No Education handout provided: No Muscles treated: Left upper trap and infraspinatus Needle size and number: .30x11mm x 4 Electrical stimulation performed: No Parameters: N/A Treatment response/outcome: Twitch response elicited and Palpable decrease in muscle tension Post-treatment instructions: Patient instructed to  expect possible mild to moderate muscle soreness later today and/or tomorrow. Patient instructed in methods to reduce muscle soreness and to continue prescribed HEP. If patient was dry needled over the lung field, patient was instructed on signs and symptoms of pneumothorax and, however unlikely, to see immediate medical attention should they occur. Patient was also educated on signs and symptoms of infection and to seek medical attention should they occur. Patient verbalized understanding of these instructions and education.    [facility] Adult PT Treatment:                                                DATE: 01/31/23 Therapeutic Exercise: Seated cervical SNAG rotation x 5 - 5" hold Seated cervical ext SNAG x 5 - 5" hold Supine chin tuck x 5 - 5" hold Prone Y/T x 5 each Manual Therpy: Skilled palpation and monitoring of muscle tension while performing TPDN Suboccipital release with gentle manual traction Therapeutic Activity:  Assessment of tests/measures, goals, and outcomes for new body part Trigger Point Dry Needling Treatment: Pre-treatment instruction: Patient instructed on dry needling rationale, procedures, and possible side effects including pain during treatment (achy,cramping feeling), bruising, drop of blood, lightheadedness, nausea, sweating. Patient Consent Given: No Education handout provided: No Muscles treated: left upper trap, left suboccipitals   Needle size and number: .30x76mm x 2 Electrical stimulation performed: No Parameters: N/A Treatment response/outcome: Twitch response elicited and Palpable decrease in muscle tension Post-treatment instructions: Patient instructed to expect possible mild to moderate muscle soreness later today and/or tomorrow. Patient instructed in methods to reduce muscle soreness and to continue prescribed HEP. If patient was dry needled over the lung field, patient was instructed on signs and symptoms of pneumothorax and, however unlikely, to see  immediate medical attention should they occur. Patient was also educated on signs and symptoms of infection and to seek medical attention should they occur. Patient verbalized understanding of these  instructions and education.   [facility] Adult PT Treatment:                                                DATE: 01/21/23 Therapeutic Exercise: Rec Bike L2 x 5 minutes  Hip abduction x 20  Figure 4 bridge x 20 each Supine chin tuck towel roll 5 sec x 12 Supine chin tuck with dowel pullovers x 10 Seated upper trap and levator stretches STS focusing on equal weight x 10+HEP   PATIENT EDUCATION:  Education details: HEP update, TPDN Person educated: Patient Education method: Explanation, Demonstration, Tactile cues, Verbal cues, Handout Education comprehension: verbalized understanding, returned demonstration, verbal cues required, tactile cues required, and needs further education  HOME EXERCISE PROGRAM: Access Code: [email or web address]    ASSESSMENT: CLINICAL IMPRESSION: Pt was able to complete all prescribed exercises with no adverse effect. Therapy focused on postural strengthening exercises to reduce pain in neck. She once again responded well to manual and TPDN, noting no pain post session. Continues to benefit from skilled PT, will progress as able.   OBJECTIVE IMPAIRMENTS: Abnormal gait, decreased activity tolerance, decreased ROM, decreased strength, impaired flexibility, and pain.   ACTIVITY LIMITATIONS: lifting, bending, sitting, standing, stairs, and locomotion level  PARTICIPATION LIMITATIONS: meal prep, cleaning, driving, shopping, community activity, and occupation  PERSONAL FACTORS: Past/current experiences and Time since onset of injury/illness/exacerbation are also affecting patient's functional outcome.    GOALS: Goals reviewed with patient? Yes  SHORT TERM GOALS: Target date: 01/03/2023  Patient will be I with initial HEP in order to progress with therapy. Baseline: HEP provided at  eval 01/10/2023: independent Goal status: MET  2.  Patient will report left knee pain </= 2/10 in order to reduce functional limitations Baseline: 5/10 01/10/2023: 0/10 Goal status: MET  3.  Patient will demonstrate proper squat technique to return to gym exercise and strength left knee Baseline: patient exhibits deviations with squat technique 01/10/2023: continues to exhibit squat deviations Goal status: ONGOING  LONG TERM GOALS: Target date: 03/14/2023  Patient will be I with final HEP to maintain progress from PT. Baseline: HEP provided at eval Goal status: ONGOING  2.  Patient will report >/= 69% status on FOTO to indicate improved functional ability. Baseline: 44% functional status 01/21/23: 72% Goal status: MET  3.  Patient will demonstrate left hip strength >/= 4/5 MMT and knee strength 5/5 MMT to improve standing and walking tolerance Baseline: see limitations above 01/21/23: see chart Goal status: ONGOING  4.  Patient will report no limitations with standing for work related tasks in order to reduce functional limitations Baseline: patient reports limitations at work 01/21/23: 2-3 hours knee pain will increase (previously 45 minutes )  Goal status: ONGOING, improving  5.  Pt will improve FOTO (neck) function score to no less than 71% as proxy for functional improvement  Baseline: 59% function Goal status: INITIAL  6.  Pt will improve bilateral cervical rotation to no less than 80 degrees for improved comfort with driving and school activities  Baseline: see ROM chart Goal status: INITIAL   PLAN: PT FREQUENCY: 1x/week  PT DURATION: 6 weeks  PLANNED INTERVENTIONS: Therapeutic exercises, Therapeutic activity, Neuromuscular re-education, Balance training, Gait training, Patient/Family education, Self Care, Joint mobilization, Aquatic Therapy, Dry Needling, Electrical stimulation, Cryotherapy, Moist heat, Taping, Ionotophoresis 4mg /ml Dexamethasone, Manual therapy, and  Re-evaluation  PLAN FOR  NEXT SESSION: Review HEP and progress PRN, manual/TPDN for left neck and shoulder region, periscapular and rotator cuff strengthening, ulnar nerve glides, progress stretching and strengthening for the left hip and knee, squat mechanics   Eloy End PT  02/24/23 2:46 PM

## 2023-02-28 ENCOUNTER — Ambulatory Visit: Payer: Medicaid Other | Admitting: Physical Therapy

## 2023-03-05 ENCOUNTER — Ambulatory Visit: Payer: Medicaid Other | Attending: Sports Medicine | Admitting: Physical Therapy

## 2023-03-05 ENCOUNTER — Encounter: Payer: Self-pay | Admitting: Physical Therapy

## 2023-03-05 ENCOUNTER — Other Ambulatory Visit: Payer: Self-pay

## 2023-03-05 DIAGNOSIS — M542 Cervicalgia: Secondary | ICD-10-CM | POA: Diagnosis present

## 2023-03-05 DIAGNOSIS — M6281 Muscle weakness (generalized): Secondary | ICD-10-CM

## 2023-03-05 DIAGNOSIS — R293 Abnormal posture: Secondary | ICD-10-CM

## 2023-03-05 DIAGNOSIS — G8929 Other chronic pain: Secondary | ICD-10-CM

## 2023-03-05 DIAGNOSIS — M25562 Pain in left knee: Secondary | ICD-10-CM | POA: Insufficient documentation

## 2023-03-05 NOTE — Therapy (Signed)
OUTPATIENT PHYSICAL THERAPY TREATMENT   Patient Name: Stacey Moss MRN: [id number] DOB:05/30/2001, 22 y.o., female Today's Date: 03/06/2023   END OF SESSION:  PT End of Session - 03/05/23 1625     Visit Number 8    Number of Visits 11    Date for PT Re-Evaluation 03/14/23    Authorization Type [employer]    Authorization - Number of Visits 27    PT Start Time 1618    PT Stop Time 1657    PT Time Calculation (min) 39 min    Activity Tolerance Patient tolerated treatment well    Behavior During Therapy [facility] for tasks assessed/performed                  Past Medical History:  Diagnosis Date   Allergic conjunctivitis 11/10/2018   Allergy    seasonal   Asthma    Bursitis of intermetatarsal bursa of left foot 09/18/2018   Eczema    History of food allergy 11/10/2018   Perennial allergic rhinitis 03/21/2015   Reactive airway disease 10/29/2018   Symptom of wheezing 10/29/2018   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   Urticaria    Well child visit 03/21/2015   Past Surgical History:  Procedure Laterality Date   WISDOM TOOTH EXTRACTION     Patient Active Problem List   Diagnosis Date Noted   Chronic pain of left knee 11/29/2022   Seasonal allergic rhinitis due to pollen 10/07/2022   Moderate persistent asthma 11/10/2018   Atopic dermatitis 11/10/2018    PCP: Dulce Sellar, NP  REFERRING PROVIDER:  Ralene Cork, DO - knee Dulce Sellar, NP - neck  REFERRING DIAG: Chronic pain of left knee  THERAPY DIAG:  Cervicalgia  Chronic pain of left knee  Muscle weakness (generalized)  Abnormal posture  Rationale for Evaluation and Treatment: Rehabilitation  ONSET DATE: February 2024   SUBJECTIVE:  SUBJECTIVE STATEMENT: Patient reports the left outside calf started bothering her again. She states her neck hurts every once in a while and they headaches are barely there anymore.   PAIN:  Are you having pain? Yes:  NPRS scale: 3/10 Pain location: Left  knee Pain description: Dull, aching, stabbing occasionally  Aggravating factors: Sitting, standing, walking Relieving factors: Elevating the leg and using heat  Are you having pain? Yes:  NPRS scale: 0/10 Worst: 6/10 Pain location: Left side of neck Pain description: Tight, HA, deep ache Aggravating factors: sitting/teaching, nodding Relieving factors: Elevating the leg and using heat  PERTINENT HISTORY: See PMH above  PRECAUTIONS: None  PATIENT GOALS: Pain relief and return to prior level of function   OBJECTIVE:  PATIENT SURVEYS:  FOTO 44% functional status  01/21/23: 72%  EDEMA:  Patient does demonstrate left ankle edema compared to right  MUSCLE LENGTH: Patient demonstrates limitations of left hamstring, ITB, and calf   03/05/2023: slight limitation bilateral hamstrings that are equal with report of increased tightness on the left of posterior leg from ankle to hamstring   PALPATION: Tender to palpation lateral hamstring, calf, and posterior knee, midline quad tenderness, tenderness over lateral femoral epicondyle and ITB  Patellar mobility WFL but with pain medial glide  01/10/2023: tenderness noted L>R upper trap and bilateral suboccipital region  03/05/2023: tenderness noted left peroneals and proximal fibular region, lateral aspect of posterior knee  CERVICAL ROM:   Active ROM A/PROM (deg) eval  Flexion   Extension   Right lateral flexion   Left lateral flexion   Right rotation 62  Left  rotation 65   (Blank rows = not tested)  LOWER EXTREMITY ROM:  Active ROM Right eval Left eval Left 03/05/2023  Hip flexion     Hip extension     Hip abduction     Hip adduction     Hip internal rotation     Hip external rotation     Knee flexion 140 140 140  Knee extension 5 hyper 5 hyper 5 hyper  Ankle dorsiflexion 12 8 10   Ankle plantarflexion   65  Ankle inversion   30  Ankle eversion   20   (Blank rows = not tested)  Patient reports posterior knee pain  with left knee hyperextension  LOWER EXTREMITY MMT:  MMT Right eval Left eval Left 01/21/23 Left 03/05/2023  Hip flexion      Hip extension 4 4- 4-   Hip abduction 4 4- 4+   Hip adduction      Hip internal rotation      Hip external rotation      Knee flexion 5 4+  5  Knee extension 5 4+  5  Ankle dorsiflexion    5  Ankle plantarflexion    5  Ankle inversion    5  Ankle eversion    5   (Blank rows = not tested)  UPPER EXTREMITY MMT:  MMT Right 02/21/2023 Left 02/21/2023  Shoulder flexion    Shoulder extension    Shoulder abduction    Shoulder adduction    Shoulder extension    Shoulder internal rotation    Shoulder external rotation 5 4+  Middle trapezius 4 4-  Lower trapezius 4 4-  Elbow flexion    Elbow extension    Wrist flexion    Wrist extension    Wrist ulnar deviation    Wrist radial deviation    Wrist pronation    Wrist supination    Grip strength    (Blank rows = not tested)   FUNCTIONAL TESTS:  Squat: weight shift to the right, decreased depth, excessive forward knee translation, dynamic knee valgus  01/10/2023: DNF endurance: 13 seconds; cervical AROM grossly WFL but patient report neck tightness and cracking with cervical flexion and extension movements 01/31/2023: DNF endurance: 15 seconds   GAIT: Assistive device utilized: None Level of assistance: Complete Independence Comments: Antalgic on left   TODAY'S TREATMENT:         [facility] Adult PT Treatment:                                                DATE: 03/05/23 Therapeutic Exercise: NuStep L5 x 5 min with UE/LE while taking subjective Supine sciatic nerve floss Supine peronea nerve floss Instruction on SMFR for peroneals Therapeutic Activity: Objective measures and reassessment of left knee/leg symptoms Manual Therpy: Skilled palpation and monitoring of muscle tension while performing TPDN STP / TPDN left upper trap and posterior cuff region Trigger Point Dry Needling  Treatment: Pre-treatment instruction: Patient instructed on dry needling rationale, procedures, and possible side effects including pain during treatment (achy,cramping feeling), bruising, drop of blood, lightheadedness, nausea, sweating. Patient Consent Given: No Education handout provided: No Muscles treated: Left peroneal longus Needle size and number: .30x39mm x 2 Electrical stimulation performed: No Parameters: N/A Treatment response/outcome: Twitch response elicited and Palpable decrease in muscle tension Post-treatment instructions: Patient instructed to expect possible mild to moderate muscle  soreness later today and/or tomorrow. Patient instructed in methods to reduce muscle soreness and to continue prescribed HEP. If patient was dry needled over the lung field, patient was instructed on signs and symptoms of pneumothorax and, however unlikely, to see immediate medical attention should they occur. Patient was also educated on signs and symptoms of infection and to seek medical attention should they occur. Patient verbalized understanding of these instructions and education.    [facility] Adult PT Treatment:                                                DATE: 02/24/23 Therapeutic Exercise: UBE L1 x 4 min (fwd/bwd) while taking subjective Supine chin tuck 10 x 5 sec Supine serratus raise 2x10 YTB Prone T thumb up 2x10 - 5 sec Prone Y thumb up 2x10 - 5 sec Prone W palm down with ext 2x10 - 5 sec Supine horizontal abd 2x15 blue band Seated double ER and scap retraction with blue x 15 Manual Therpy: Skilled palpation and monitoring of muscle tension while performing TPDN STP / TPDN left upper trap and posterior cuff region Suboccipital release Positional release left upper trap Trigger Point Dry Needling Treatment: Pre-treatment instruction: Patient instructed on dry needling rationale, procedures, and possible side effects including pain during treatment (achy,cramping feeling), bruising,  drop of blood, lightheadedness, nausea, sweating. Patient Consent Given: No Education handout provided: No Muscles treated: Left upper trap Needle size and number: .30x41mm x 2 Electrical stimulation performed: No Parameters: N/A Treatment response/outcome: Twitch response elicited and Palpable decrease in muscle tension Post-treatment instructions: Patient instructed to expect possible mild to moderate muscle soreness later today and/or tomorrow. Patient instructed in methods to reduce muscle soreness and to continue prescribed HEP. If patient was dry needled over the lung field, patient was instructed on signs and symptoms of pneumothorax and, however unlikely, to see immediate medical attention should they occur. Patient was also educated on signs and symptoms of infection and to seek medical attention should they occur. Patient verbalized understanding of these instructions and education.   [facility] Adult PT Treatment:                                                DATE: 02/21/23 Therapeutic Exercise: UBE L1 x 4 min (fwd/bwd) while taking subjective Supine chin tuck 10 x 5 sec Prone I palm down 10 x 5 sec Prone T thumb up 10 x 5 sec Prone Y thumb up 10 x 5 sec Prone W palm down 10 x 5 sec Seated double ER and scap retraction with blue 2 x 10 Manual Therpy: Skilled palpation and monitoring of muscle tension while performing TPDN STP / TPDN left upper trap and posterior cuff region Trigger Point Dry Needling Treatment: Pre-treatment instruction: Patient instructed on dry needling rationale, procedures, and possible side effects including pain during treatment (achy,cramping feeling), bruising, drop of blood, lightheadedness, nausea, sweating. Patient Consent Given: No Education handout provided: No Muscles treated: Left upper trap and infraspinatus Needle size and number: .30x73mm x 4 Electrical stimulation performed: No Parameters: N/A Treatment response/outcome: Twitch response elicited  and Palpable decrease in muscle tension Post-treatment instructions: Patient instructed to expect possible mild to moderate muscle soreness later today and/or  tomorrow. Patient instructed in methods to reduce muscle soreness and to continue prescribed HEP. If patient was dry needled over the lung field, patient was instructed on signs and symptoms of pneumothorax and, however unlikely, to see immediate medical attention should they occur. Patient was also educated on signs and symptoms of infection and to seek medical attention should they occur. Patient verbalized understanding of these instructions and education.   PATIENT EDUCATION:  Education details: HEP, TPDN Person educated: Patient Education method: Explanation, Demonstration, Tactile cues, Verbal cues Education comprehension: verbalized understanding, returned demonstration, verbal cues required, tactile cues required, and needs further education  HOME EXERCISE PROGRAM: Access Code: [email or web address]    ASSESSMENT: CLINICAL IMPRESSION: Patient tolerated therapy well with no adverse effects. She reports return of left lateral lower leg pain with no apparent mechanism of injury. Her pain is located over left peroneal and proximal fibular region with pain that radiates to the left ankle and posterior knee. Unclear exact etiology of pain, her symptoms are not consistent with any specific knee related pathology such as patellofemoral pain, meniscus or ligamentous injury. Her symptoms are consistent with possible peroneal muscle dysfunction related to previous ankle fracture, peroneal nerve pathology, baker's cyst. Performed TPDN for proximal peroneal region with patient reporting reproduction of concordant symptoms. She does seem to exhibit some increased nerve tension on the left as well so performed nerve flossing exercises. No changes made to HEP this visit. Patient would benefit from continued skilled PT to progress her mobility and strength in order  to reduce pain and maximize functional ability.    OBJECTIVE IMPAIRMENTS: Abnormal gait, decreased activity tolerance, decreased ROM, decreased strength, impaired flexibility, and pain.   ACTIVITY LIMITATIONS: lifting, bending, sitting, standing, stairs, and locomotion level  PARTICIPATION LIMITATIONS: meal prep, cleaning, driving, shopping, community activity, and occupation  PERSONAL FACTORS: Past/current experiences and Time since onset of injury/illness/exacerbation are also affecting patient's functional outcome.    GOALS: Goals reviewed with patient? Yes  SHORT TERM GOALS: Target date: 01/03/2023  Patient will be I with initial HEP in order to progress with therapy. Baseline: HEP provided at eval 01/10/2023: independent Goal status: MET  2.  Patient will report left knee pain </= 2/10 in order to reduce functional limitations Baseline: 5/10 01/10/2023: 0/10 Goal status: MET  3.  Patient will demonstrate proper squat technique to return to gym exercise and strength left knee Baseline: patient exhibits deviations with squat technique 01/10/2023: continues to exhibit squat deviations Goal status: ONGOING  LONG TERM GOALS: Target date: 03/14/2023  Patient will be I with final HEP to maintain progress from PT. Baseline: HEP provided at eval Goal status: ONGOING  2.  Patient will report >/= 69% status on FOTO to indicate improved functional ability. Baseline: 44% functional status 01/21/23: 72% Goal status: MET  3.  Patient will demonstrate left hip strength >/= 4/5 MMT and knee strength 5/5 MMT to improve standing and walking tolerance Baseline: see limitations above 01/21/23: see chart Goal status: ONGOING  4.  Patient will report no limitations with standing for work related tasks in order to reduce functional limitations Baseline: patient reports limitations at work 01/21/23: 2-3 hours knee pain will increase (previously 45 minutes )  Goal status: ONGOING, improving  5.   Pt will improve FOTO (neck) function score to no less than 71% as proxy for functional improvement  Baseline: 59% function Goal status: INITIAL  6.  Pt will improve bilateral cervical rotation to no less than 80 degrees for improved comfort with  driving and school activities  Baseline: see ROM chart Goal status: INITIAL   PLAN: PT FREQUENCY: 1x/week  PT DURATION: 6 weeks  PLANNED INTERVENTIONS: Therapeutic exercises, Therapeutic activity, Neuromuscular re-education, Balance training, Gait training, Patient/Family education, Self Care, Joint mobilization, Aquatic Therapy, Dry Needling, Electrical stimulation, Cryotherapy, Moist heat, Taping, Ionotophoresis 4mg /ml Dexamethasone, Manual therapy, and Re-evaluation  PLAN FOR NEXT SESSION: Review HEP and progress PRN, manual/TPDN for left neck and shoulder region, periscapular and rotator cuff strengthening, ulnar nerve glides, progress stretching and strengthening for the left hip and knee, squat mechanics   Rosana Hoes, PT, DPT, LAT, ATC 03/06/23  8:18 AM Phone: [phone] Fax: [phone]

## 2023-03-06 ENCOUNTER — Encounter: Payer: Self-pay | Admitting: Family Medicine

## 2023-03-06 ENCOUNTER — Ambulatory Visit: Payer: Medicaid Other | Admitting: Family Medicine

## 2023-03-06 VITALS — BP 110/70 | Ht 66.0 in | Wt 146.0 lb

## 2023-03-06 DIAGNOSIS — M2392 Unspecified internal derangement of left knee: Secondary | ICD-10-CM

## 2023-03-06 NOTE — Progress Notes (Signed)
DATE OF VISIT: 03/06/2023        Stacey Moss DOB: 02-Aug-2000 MRN: [id number]  CC:  f/u left knee pain  History- Stacey Moss is a 22 y.o.  female for evaluation and treatment of chronic left knee pain.  Last seen by Dr Pearletha Forge 12/23/22.  Ongoing pain since Feb 2024.  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 Plays sports - golf, tennis, walks  Limited improvement with PT since 11/29/22.  Has been doing regular HEP - last visit yesterday -- notes from visit reviewed  - did dry needling yesterday - PT noted increased  No improvement with icing Some improvement with heat Some improvement with CBD topical No improvement with Naproxen for 6+ weeks Tylenol helps some Using [employer] knee sleeve with work  Past Medical History Past Medical History:  Diagnosis Date   Allergic conjunctivitis 11/10/2018   Allergy    seasonal   Asthma    Bursitis of intermetatarsal bursa of left foot 09/18/2018   Eczema    History of food allergy 11/10/2018   Perennial allergic rhinitis 03/21/2015   Reactive airway disease 10/29/2018   Symptom of wheezing 10/29/2018   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   Urticaria    Well child visit 03/21/2015    Past Surgical History Past Surgical History:  Procedure Laterality Date   WISDOM TOOTH EXTRACTION      Medications Current Outpatient Medications  Medication Sig Dispense Refill   cetirizine (ZYRTEC) 10 MG tablet Take 10 mg by mouth daily.     Lactic Ac-Citric Ac-Pot Bitart (PHEXXI) 1.8-1-0.4 % GEL Place 5 g vaginally as directed. Before intercourse 120 g 11   naproxen (NAPROSYN) 500 MG tablet Take 1 tablet (500 mg total) by mouth 2 (two) times daily with a meal. 60 tablet 2   Probiotic Product  (PROBIOTIC PO) Take by mouth.     No current facility-administered medications for this visit.    Allergies is allergic to justicia adhatoda (malabar nut tree) [justicia adhatoda], other, and augmentin [amoxicillin-pot clavulanate].  Family History - reviewed per EMR and intake form  Social History   reports that she does not currently use alcohol.  reports that she has never smoked. She has been exposed to tobacco smoke. She has never used smokeless tobacco.  reports no history of drug use. 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   EXAM: Vitals: BP 110/70   Ht 5\' 6"  (1.676 m)   Wt 146 lb (66.2 kg)   BMI 23.57 kg/m  General: AOx3, NAD, pleasant SKIN: no rashes or lesions, skin clean, dry, intact MSK: Left knee without swelling or effusion.  Full range of motion with 1-2+ patellofemoral crepitus.  Tender to palpation along the medial joint line, mild tenderness palpation along the lateral joint line and along proximal peroneal muscle on the left.  Positive McMurray with pain and palpable click.  Negative Lachman, negative varus/valgus stress, negative posterior drawer.  Positive patellar grind.  Negative patellar apprehension. Right knee with full range of motion without pain, weakness, swelling, instability. Walking without a limp  NEURO: sensation intact to light touch lower extremity bilaterally VASC: pulses 2+ and symmetric  DP/PT bilaterally, no edema  IMAGING: XRAYS: LT KNEE XR 12/07/22 4 views showing: No acute abnormality   Assessment & Plan Internal derangement of left knee Ongoing left knee pain since 07/2022, no specific injury or trauma.  No improvements with 3 months of PT and regular HEP, no improvement with 6+ weeks of naproxen p.o., no improvement with bracing, no improvement with icing.  History and exam concerning for possible meniscus tear, she has not improved with  conservative therapy, would be candidate for surgical intervention if indicated  1.  Prior visit notes from 12/23/2022 and 11/29/2022 reviewed in detail today. 2.  Left knee x-rays from 12/07/2022 were reviewed showing no acute abnormalities 3.  MRI left knee ordered to rule out meniscus tear, other internal derangement.  Patient would be interested in surgical intervention if indicated 4.  Will continue with PT as she is doing 5.  Continue with knee sleeve as needed with activity 6.  Continue Tylenol and/or naproxen as needed 7.  Follow-up pending MRI to discuss results and further treatment.  Patient expressed understanding agreement with above, all questions were answered   Encounter Diagnosis  Name Primary?   Internal derangement of left knee Yes    Orders Placed This Encounter  Procedures   MR KNEE LEFT WO CONTRAST    Orders Placed This Encounter  Procedures   MR KNEE LEFT WO CONTRAST

## 2023-03-10 ENCOUNTER — Ambulatory Visit
Admission: RE | Admit: 2023-03-10 | Discharge: 2023-03-10 | Disposition: A | Discharge status: Home / Self Care | Payer: Medicaid Other | Source: Ambulatory Visit | Attending: Family Medicine | Admitting: Family Medicine

## 2023-03-10 ENCOUNTER — Ambulatory Visit: Payer: Medicaid Other | Admitting: Family Medicine

## 2023-03-10 DIAGNOSIS — M2392 Unspecified internal derangement of left knee: Secondary | ICD-10-CM

## 2023-03-14 ENCOUNTER — Ambulatory Visit: Payer: Medicaid Other

## 2023-03-14 DIAGNOSIS — M542 Cervicalgia: Secondary | ICD-10-CM

## 2023-03-14 DIAGNOSIS — R293 Abnormal posture: Secondary | ICD-10-CM

## 2023-03-14 DIAGNOSIS — G8929 Other chronic pain: Secondary | ICD-10-CM

## 2023-03-14 DIAGNOSIS — M6281 Muscle weakness (generalized): Secondary | ICD-10-CM

## 2023-03-14 NOTE — Therapy (Signed)
OUTPATIENT PHYSICAL THERAPY TREATMENT   Patient Name: Stacey Moss MRN: [id number] DOB:Jun 18, 2000, 22 y.o., female Today's Date: 03/14/2023   END OF SESSION:  PT End of Session - 03/14/23 0935     Visit Number 9    Number of Visits 11    Date for PT Re-Evaluation 04/11/23    Authorization Type [employer]    Authorization - Number of Visits 27    PT Start Time 0935    PT Stop Time 1010    PT Time Calculation (min) 35 min    Activity Tolerance Patient tolerated treatment well    Behavior During Therapy [facility] for tasks assessed/performed                   Past Medical History:  Diagnosis Date   Allergic conjunctivitis 11/10/2018   Allergy    seasonal   Asthma    Bursitis of intermetatarsal bursa of left foot 09/18/2018   Eczema    History of food allergy 11/10/2018   Perennial allergic rhinitis 03/21/2015   Reactive airway disease 10/29/2018   Symptom of wheezing 10/29/2018   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   Urticaria    Well child visit 03/21/2015   Past Surgical History:  Procedure Laterality Date   WISDOM TOOTH EXTRACTION     Patient Active Problem List   Diagnosis Date Noted   Chronic pain of left knee 11/29/2022   Seasonal allergic rhinitis due to pollen 10/07/2022   Moderate persistent asthma 11/10/2018   Atopic dermatitis 11/10/2018    PCP: Dulce Sellar, NP  REFERRING PROVIDER:  Ralene Cork, DO - knee Dulce Sellar, NP - neck  REFERRING DIAG: Chronic pain of left knee  THERAPY DIAG:  Cervicalgia - Plan: PT plan of care cert/re-cert  Chronic pain of left knee - Plan: PT plan of care cert/re-cert  Muscle weakness (generalized) - Plan: PT plan of care cert/re-cert  Abnormal posture - Plan: PT plan of care cert/re-cert  Rationale for Evaluation and Treatment: Rehabilitation  ONSET DATE: February 2024   SUBJECTIVE:  SUBJECTIVE STATEMENT: Pt presents to PT with reports of L knee pain continued. Has been compliant with HEP. Had MRI  of L knee to assess possible meniscal injury.   PAIN:  Are you having pain? Yes:  NPRS scale: 3/10 Pain location: Left knee Pain description: Dull, aching, stabbing occasionally  Aggravating factors: Sitting, standing, walking Relieving factors: Elevating the leg and using heat  Are you having pain? Yes:  NPRS scale: 0/10 Worst: 6/10 Pain location: Left side of neck Pain description: Tight, HA, deep ache Aggravating factors: sitting/teaching, nodding Relieving factors: Elevating the leg and using heat  PERTINENT HISTORY: See PMH above  PRECAUTIONS: None  PATIENT GOALS: Pain relief and return to prior level of function   OBJECTIVE:  PATIENT SURVEYS:  FOTO 44% functional status  01/21/23: 72%  EDEMA:  Patient does demonstrate left ankle edema compared to right  MUSCLE LENGTH: Patient demonstrates limitations of left hamstring, ITB, and calf   03/05/2023: slight limitation bilateral hamstrings that are equal with report of increased tightness on the left of posterior leg from ankle to hamstring   PALPATION: Tender to palpation lateral hamstring, calf, and posterior knee, midline quad tenderness, tenderness over lateral femoral epicondyle and ITB  Patellar mobility WFL but with pain medial glide  01/10/2023: tenderness noted L>R upper trap and bilateral suboccipital region  03/05/2023: tenderness noted left peroneals and proximal fibular region, lateral aspect of posterior knee  CERVICAL ROM:  Active ROM A/PROM (deg) eval  Flexion   Extension   Right lateral flexion   Left lateral flexion   Right rotation 62  Left rotation 65   (Blank rows = not tested)  LOWER EXTREMITY ROM:  Active ROM Right eval Left eval Left 03/05/2023  Hip flexion     Hip extension     Hip abduction     Hip adduction     Hip internal rotation     Hip external rotation     Knee flexion 140 140 140  Knee extension 5 hyper 5 hyper 5 hyper  Ankle dorsiflexion 12 8 10   Ankle  plantarflexion   65  Ankle inversion   30  Ankle eversion   20   (Blank rows = not tested)  Patient reports posterior knee pain with left knee hyperextension  LOWER EXTREMITY MMT:  MMT Right eval Left eval Left 01/21/23 Left 03/05/2023  Hip flexion      Hip extension 4 4- 4-   Hip abduction 4 4- 4+   Hip adduction      Hip internal rotation      Hip external rotation      Knee flexion 5 4+  5  Knee extension 5 4+  5  Ankle dorsiflexion    5  Ankle plantarflexion    5  Ankle inversion    5  Ankle eversion    5   (Blank rows = not tested)  UPPER EXTREMITY MMT:  MMT Right 02/21/2023 Left 02/21/2023  Shoulder flexion    Shoulder extension    Shoulder abduction    Shoulder adduction    Shoulder extension    Shoulder internal rotation    Shoulder external rotation 5 4+  Middle trapezius 4 4-  Lower trapezius 4 4-  Elbow flexion    Elbow extension    Wrist flexion    Wrist extension    Wrist ulnar deviation    Wrist radial deviation    Wrist pronation    Wrist supination    Grip strength    (Blank rows = not tested)   FUNCTIONAL TESTS:  Squat: weight shift to the right, decreased depth, excessive forward knee translation, dynamic knee valgus  01/10/2023: DNF endurance: 13 seconds; cervical AROM grossly WFL but patient report neck tightness and cracking with cervical flexion and extension movements 01/31/2023: DNF endurance: 15 seconds   GAIT: Assistive device utilized: None Level of assistance: Complete Independence Comments: Antalgic on left   TODAY'S TREATMENT:         [facility] Adult PT Treatment:                                                DATE: 03/14/23 Therapeutic Exercise: Rec bike lvl 2 x 3 min while taking subjective Supine QS x 10 - 5" hold Supine SLR 2x10 2# S/L hip abd 2x10 2# Bridge with GTB 2x10  S/L clamshell x 15 GTB each L ankle ev 2x10 RTB Manual Therpy: Skilled palpation and monitoring of muscle tension while performing TPDN STP / TPDN  L lateral LE Trigger Point Dry Needling Treatment: Pre-treatment instruction: Patient instructed on dry needling rationale, procedures, and possible side effects including pain during treatment (achy,cramping feeling), bruising, drop of blood, lightheadedness, nausea, sweating. Patient Consent Given: No Education handout provided: No Muscles treated: Left peroneal longus, L lateral gastroc/soleus  Needle size and number: .30x32mm x 2; .30x3mm x 1 Electrical stimulation performed: No Parameters: N/A Treatment response/outcome: Twitch response elicited and Palpable decrease in muscle tension Post-treatment instructions: Patient instructed to expect possible mild to moderate muscle soreness later today and/or tomorrow. Patient instructed in methods to reduce muscle soreness and to continue prescribed HEP. If patient was dry needled over the lung field, patient was instructed on signs and symptoms of pneumothorax and, however unlikely, to see immediate medical attention should they occur. Patient was also educated on signs and symptoms of infection and to seek medical attention should they occur. Patient verbalized understanding of these instructions and education.   [facility] Adult PT Treatment:                                                DATE: 03/05/23 Therapeutic Exercise: NuStep L5 x 5 min with UE/LE while taking subjective Supine sciatic nerve floss Supine peronea nerve floss Instruction on SMFR for peroneals Therapeutic Activity: Objective measures and reassessment of left knee/leg symptoms Manual Therpy: Skilled palpation and monitoring of muscle tension while performing TPDN STP / TPDN left upper trap and posterior cuff region Trigger Point Dry Needling Treatment: Pre-treatment instruction: Patient instructed on dry needling rationale, procedures, and possible side effects including pain during treatment (achy,cramping feeling), bruising, drop of blood, lightheadedness, nausea,  sweating. Patient Consent Given: No Education handout provided: No Muscles treated: Left peroneal longus Needle size and number: .30x43mm x 2 Electrical stimulation performed: No Parameters: N/A Treatment response/outcome: Twitch response elicited and Palpable decrease in muscle tension Post-treatment instructions: Patient instructed to expect possible mild to moderate muscle soreness later today and/or tomorrow. Patient instructed in methods to reduce muscle soreness and to continue prescribed HEP. If patient was dry needled over the lung field, patient was instructed on signs and symptoms of pneumothorax and, however unlikely, to see immediate medical attention should they occur. Patient was also educated on signs and symptoms of infection and to seek medical attention should they occur. Patient verbalized understanding of these instructions and education.   PATIENT EDUCATION:  Education details: HEP, TPDN Person educated: Patient Education method: Explanation, Demonstration, Tactile cues, Verbal cues Education comprehension: verbalized understanding, returned demonstration, verbal cues required, tactile cues required, and needs further education  HOME EXERCISE PROGRAM: Access Code: [email or web address]    ASSESSMENT: CLINICAL IMPRESSION: Pt was able to complete all prescribed exercises with no adverse effect. Therpay today focused on quad and lateral hip strength, also added exercise for L ankle evertors. PT extended POC additional 4 weeks. Will keep progressing and try to decrease L LE pain.   OBJECTIVE IMPAIRMENTS: Abnormal gait, decreased activity tolerance, decreased ROM, decreased strength, impaired flexibility, and pain.   ACTIVITY LIMITATIONS: lifting, bending, sitting, standing, stairs, and locomotion level  PARTICIPATION LIMITATIONS: meal prep, cleaning, driving, shopping, community activity, and occupation  PERSONAL FACTORS: Past/current experiences and Time since onset of  injury/illness/exacerbation are also affecting patient's functional outcome.    GOALS: Goals reviewed with patient? Yes  SHORT TERM GOALS: Target date: 01/03/2023  Patient will be I with initial HEP in order to progress with therapy. Baseline: HEP provided at eval 01/10/2023: independent Goal status: MET  2.  Patient will report left knee pain </= 2/10 in order to reduce functional limitations Baseline: 5/10 01/10/2023: 0/10 Goal status: MET  3.  Patient will demonstrate proper squat  technique to return to gym exercise and strength left knee Baseline: patient exhibits deviations with squat technique 01/10/2023: continues to exhibit squat deviations Goal status: ONGOING  LONG TERM GOALS: Target date: 04/11/2023    Patient will be I with final HEP to maintain progress from PT. Baseline: HEP provided at eval Goal status: ONGOING  2.  Patient will report >/= 69% status on FOTO to indicate improved functional ability. Baseline: 44% functional status 01/21/23: 72% Goal status: MET  3.  Patient will demonstrate left hip strength >/= 4/5 MMT and knee strength 5/5 MMT to improve standing and walking tolerance Baseline: see limitations above 01/21/23: see chart Goal status: ONGOING  4.  Patient will report no limitations with standing for work related tasks in order to reduce functional limitations Baseline: patient reports limitations at work 01/21/23: 2-3 hours knee pain will increase (previously 45 minutes )  Goal status: ONGOING, improving  5.  Pt will improve FOTO (neck) function score to no less than 71% as proxy for functional improvement  Baseline: 59% function Goal status: INITIAL  6.  Pt will improve bilateral cervical rotation to no less than 80 degrees for improved comfort with driving and school activities  Baseline: see ROM chart Goal status: INITIAL   PLAN: PT FREQUENCY: 1x/week  PT DURATION: 6 weeks  PLANNED INTERVENTIONS: Therapeutic exercises, Therapeutic  activity, Neuromuscular re-education, Balance training, Gait training, Patient/Family education, Self Care, Joint mobilization, Aquatic Therapy, Dry Needling, Electrical stimulation, Cryotherapy, Moist heat, Taping, Ionotophoresis 4mg /ml Dexamethasone, Manual therapy, and Re-evaluation  PLAN FOR NEXT SESSION: Review HEP and progress PRN, manual/TPDN for left neck and shoulder region, periscapular and rotator cuff strengthening, ulnar nerve glides, progress stretching and strengthening for the left hip and knee, squat mechanics   Eloy End PT  03/14/23 10:19 AM

## 2023-03-17 ENCOUNTER — Ambulatory Visit (INDEPENDENT_AMBULATORY_CARE_PROVIDER_SITE_OTHER): Payer: Medicaid Other | Admitting: Family Medicine

## 2023-03-17 ENCOUNTER — Other Ambulatory Visit: Payer: Self-pay | Admitting: *Deleted

## 2023-03-17 ENCOUNTER — Encounter: Payer: Self-pay | Admitting: Family Medicine

## 2023-03-17 VITALS — BP 116/71 | HR 104 | Temp 98.3°F | Resp 20 | Ht 66.0 in | Wt 146.2 lb

## 2023-03-17 VITALS — BP 106/61 | Ht 66.0 in | Wt 146.0 lb

## 2023-03-17 DIAGNOSIS — M25562 Pain in left knee: Secondary | ICD-10-CM

## 2023-03-17 DIAGNOSIS — G8929 Other chronic pain: Secondary | ICD-10-CM

## 2023-03-17 DIAGNOSIS — M79602 Pain in left arm: Secondary | ICD-10-CM | POA: Diagnosis not present

## 2023-03-17 LAB — EKG 12-LEAD

## 2023-03-17 NOTE — Patient Instructions (Addendum)
EKG is normal. SR 92 Continue heat, stretches, rest, NSAID Follow-up with sports medicine this afternoon as planned

## 2023-03-17 NOTE — Progress Notes (Signed)
Erroneous encounter
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
## 2023-03-18 ENCOUNTER — Encounter: Payer: Self-pay | Admitting: Family Medicine

## 2023-03-18 NOTE — Progress Notes (Signed)
Patient comes into the office today to go over her knee MRI.  Advised this was normal.  Beighton score 0/9.  Encouraged physical therapy, home exercise program.

## 2023-03-20 NOTE — Progress Notes (Deleted)
NEUROLOGY CONSULTATION NOTE  Stacey Moss MRN: [id number] DOB: 08-31-2000  Referring provider: Glynn Octave, MD (ED referral) Primary care provider: Hyman Hopes, NP  Reason for consult:  migraine  Assessment/Plan:   ***   Subjective:  Stacey Moss is a 22 year old ***-handed female with seasonal allergies, asthma, TMJ dysfunction and anxiety who presents for migraines.  History supplemented by ED and primary care notes.  CT/CV head and CTA head and neck personally reviewed.  Onset:  July 2024.   Location:  left sided from base of neck radiating to left side of her forehead Quality:  *** Intensity:  ***.  She denies thunderclap headache or severe headache that wakes her from sleep. Aura:  absent Prodrome:  absent Associated symptoms:  ***.  She denies associated nausea, vomiting, photophobia, phonophobia, unilateral numbness or weakness. Duration:  *** Frequency:  *** Frequency of abortive medication: *** Triggers:  *** Relieving factors:  *** Activity:  *** Initially, she was told that she may have had an allergic reaction or possible angioedema and was prescribed antihistamines ***.  She tested negative for COVID.  She saw a chiropractor who took an X-ray of her cervical spine and was told that she had lack of curvature.  She underwent chiropractic adjustment and physical therapy ***.  She also had been experiencing ***.  She had an eye exam and received new contacts without significant improvement.  She was evaluated in the ED on 01/19/2023.  CT/CTV of head and CTA head and neck were unremarkable.  No evidence of infection or illness.  Treated with Benadryl.  She   Past NSAIDS/analgesics:  meloxicam, ibuprofen, Tylenol, Toradol Past abortive triptans:  none Past abortive ergotamine:  none Past muscle relaxants:  Flexeril Past anti-emetic:  Zofran, Reglan Past antihypertensive medications:  none Past antidepressant medications:  *** Past anticonvulsant medications:   *** Past anti-CGRP:  none Past antihistamines/decongestants:  Flonase, Claritin Other past therapies:  prednisone  Current NSAIDS/analgesics:  naproxen 500mg  PRN (***) Current triptans:  none Current ergotamine:  none Current anti-emetic:  none Current muscle relaxants:  none Current Antihypertensive medications:  none Current Antidepressant medications:  none Current Anticonvulsant medications:  none Current anti-CGRP:  none Current Antihistamines/Decongestants:  Zyrtec Other therapy:  *** Birth control:  none   Caffeine:  *** Alcohol:  *** Smoker:  *** Diet:  *** Exercise:  *** Depression:  ***; Anxiety:  *** Other pain:  *** Sleep hygiene:  *** Family history of headache:  ***      PAST MEDICAL HISTORY: Past Medical History:  Diagnosis Date   Allergic conjunctivitis 11/10/2018   Allergy    seasonal   Asthma    Bursitis of intermetatarsal bursa of left foot 09/18/2018   Eczema    History of food allergy 11/10/2018   Perennial allergic rhinitis 03/21/2015   Reactive airway disease 10/29/2018   Symptom of wheezing 10/29/2018   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   Urticaria    Well child visit 03/21/2015    PAST SURGICAL HISTORY: Past Surgical History:  Procedure Laterality Date   WISDOM TOOTH EXTRACTION      MEDICATIONS: Current Outpatient Medications on File Prior to Visit  Medication Sig Dispense Refill   cetirizine (ZYRTEC) 10 MG tablet Take 10 mg by mouth daily.     Lactic Ac-Citric Ac-Pot Bitart (PHEXXI) 1.8-1-0.4 % GEL Place 5 g vaginally as directed. Before intercourse 120 g 11   naproxen (NAPROSYN) 500 MG tablet Take 1 tablet (500 mg total) by mouth 2 (two)  times daily with a meal. 60 tablet 2   Probiotic Product (PROBIOTIC PO) Take by mouth.     No current facility-administered medications on file prior to visit.    ALLERGIES: Allergies  Allergen Reactions   Justicia Adhatoda (Malabar Nut Tree) [Justicia Adhatoda] Hives   Other     TREE NUTS    Augmentin [Amoxicillin-Pot Clavulanate] Rash    FAMILY HISTORY: Family History  Problem Relation Age of Onset   Arthritis Mother    Asthma Mother    Allergic rhinitis Brother    Breast cancer Maternal Aunt    Asthma Maternal Grandmother    Asthma Maternal Grandfather    Cancer Paternal Grandmother        breast   Breast cancer Paternal Grandmother 50    Objective:  *** General: No acute distress.  Patient appears well-groomed.   Head:  Normocephalic/atraumatic Eyes:  fundi examined but not visualized Neck: supple, no paraspinal tenderness, full range of motion Heart: regular rate and rhythm Neurological Exam: Mental status: alert and oriented to person, place, and time, speech fluent and not dysarthric, language intact. Cranial nerves: CN I: not tested CN II: pupils equal, round and reactive to light, visual fields intact CN III, IV, VI:  full range of motion, no nystagmus, no ptosis CN V: facial sensation intact. CN VII: upper and lower face symmetric CN VIII: hearing intact CN IX, X: gag intact, uvula midline CN XI: sternocleidomastoid and trapezius muscles intact CN XII: tongue midline Bulk & Tone: normal, no fasciculations. Motor:  muscle strength 5/5 throughout Sensation:  Pinprick, temperature and vibratory sensation intact. Deep Tendon Reflexes:  2+ throughout,  toes downgoing.   Finger to nose testing:  Without dysmetria.     Gait:  Normal station and stride.  Romberg negative.    Thank you for allowing me to take part in the care of this patient.  Shon Millet, DO  CC: Hyman Hopes, NP

## 2023-03-24 ENCOUNTER — Ambulatory Visit: Payer: Medicaid Other | Admitting: Family Medicine

## 2023-03-24 ENCOUNTER — Ambulatory Visit: Payer: Medicaid Other | Admitting: Neurology

## 2023-03-25 ENCOUNTER — Encounter: Payer: Self-pay | Admitting: Family Medicine

## 2023-03-25 ENCOUNTER — Ambulatory Visit: Payer: Medicaid Other | Attending: Family Medicine

## 2023-03-25 ENCOUNTER — Encounter: Payer: Self-pay | Admitting: Neurology

## 2023-03-25 ENCOUNTER — Ambulatory Visit: Payer: Medicaid Other | Admitting: Family Medicine

## 2023-03-25 VITALS — BP 119/77 | HR 90 | Ht 66.0 in | Wt 147.0 lb

## 2023-03-25 DIAGNOSIS — R002 Palpitations: Secondary | ICD-10-CM | POA: Diagnosis not present

## 2023-03-25 NOTE — Progress Notes (Signed)
Acute Office Visit  Subjective:     Patient ID: Stacey Moss, female    DOB: 2000-08-19, 22 y.o.   MRN: [id number]  Chief Complaint  Patient presents with   Medical Management of Chronic Issues    HPI Patient is in today for palpitations, chest discomfort.   Discussed the use of AI scribe software for clinical note transcription with the patient, who gave verbal consent to proceed.  History of Present Illness   The patient, with a history of anxiety, presents with intermittent chest discomfort and palpitations. She reports the discomfort is most noticeable when sitting or lying down, describing it as a sharp pain that is sometimes synchronous with her heartbeat. The pain is localized to the mid-sternal region and occasionally radiates to the sub-sternal area. The patient also reports a sensation of her heart racing or beating noticeably, which is a new symptom for her.  The patient has noticed that the discomfort tends to occur in the mid-afternoon after she has been active, and is not present in the mornings. She also describes a sensation similar to stomach growling or hunger pangs in the chest area prior to the onset of palpitations. However, she denies any associated dyspnea, cough, or significant shortness of breath, attributing minor breathlessness to seasonal allergies.  The patient has sought consultation with sports medicine for an unrelated arm issue, and had a normal EKG here at last visit on 03/17/23. She expresses a high level of anxiety about the symptoms, fearing a serious cardiac condition despite reassurances. She is currently awaiting an appointment with a mental health professional to address her anxiety.            ROS All review of systems negative except what is listed in the HPI      Objective:    BP 119/77   Pulse 90   Ht 5\' 6"  (1.676 m)   Wt 147 lb (66.7 kg)   LMP 03/10/2023   SpO2 98%   BMI 23.73 kg/m    Physical Exam Vitals reviewed.   Constitutional:      Appearance: Normal appearance.  Cardiovascular:     Rate and Rhythm: Normal rate and regular rhythm.  Pulmonary:     Effort: Pulmonary effort is normal.  Skin:    General: Skin is warm and dry.  Neurological:     Mental Status: She is alert and oriented to person, place, and time.  Psychiatric:        Mood and Affect: Mood normal.        Behavior: Behavior normal.     No results found for any visits on 03/25/23.      Assessment & Plan:   Problem List Items Addressed This Visit   None Visit Diagnoses     Palpitations    -  Primary   Relevant Orders   LONG TERM MONITOR (3-14 DAYS)      Intermittent chest discomfort and palpitations, more noticeable when sitting or lying down. Described as a sharp pain, sometimes in sync with heartbeats. No shortness of breath. Normal EKG last week and stable labs recently. Possible anxiety component. -Order 2-week heart monitor to capture any abnormal heart rhythms during symptomatic periods. -Encourage patient to keep a symptom diary noting activities, feelings, and symptoms during episodes. -Patient has upcoming appointments with mental health professionals. Encourage patient to keep these appointments to address anxiety.     Patient aware of signs/symptoms requiring further/urgent evaluation.    No orders of the defined  types were placed in this encounter.   Return if symptoms worsen or fail to improve.  Clayborne Dana, NP

## 2023-03-25 NOTE — Progress Notes (Unsigned)
EP to read

## 2023-03-26 ENCOUNTER — Encounter: Payer: Self-pay | Admitting: Physical Therapy

## 2023-03-26 ENCOUNTER — Other Ambulatory Visit: Payer: Self-pay

## 2023-03-26 ENCOUNTER — Ambulatory Visit: Payer: Medicaid Other | Admitting: Physical Therapy

## 2023-03-26 DIAGNOSIS — R293 Abnormal posture: Secondary | ICD-10-CM

## 2023-03-26 DIAGNOSIS — G8929 Other chronic pain: Secondary | ICD-10-CM

## 2023-03-26 DIAGNOSIS — M542 Cervicalgia: Secondary | ICD-10-CM

## 2023-03-26 DIAGNOSIS — M6281 Muscle weakness (generalized): Secondary | ICD-10-CM

## 2023-03-26 NOTE — Therapy (Signed)
OUTPATIENT PHYSICAL THERAPY TREATMENT   Patient Name: Stacey Moss MRN: [id number] DOB:09/09/2000, 22 y.o., female Today's Date: 03/26/2023   END OF SESSION:  PT End of Session - 03/26/23 0823     Visit Number 10    Number of Visits 11    Date for PT Re-Evaluation 04/11/23    Authorization Type [employer]    Authorization - Number of Visits 27    PT Start Time 0803    PT Stop Time 0845    PT Time Calculation (min) 42 min    Activity Tolerance Patient tolerated treatment well    Behavior During Therapy [facility] for tasks assessed/performed                    Past Medical History:  Diagnosis Date   Allergic conjunctivitis 11/10/2018   Allergy    seasonal   Asthma    Bursitis of intermetatarsal bursa of left foot 09/18/2018   Eczema    History of food allergy 11/10/2018   Perennial allergic rhinitis 03/21/2015   Reactive airway disease 10/29/2018   Symptom of wheezing 10/29/2018   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   Urticaria    Well child visit 03/21/2015   Past Surgical History:  Procedure Laterality Date   WISDOM TOOTH EXTRACTION     Patient Active Problem List   Diagnosis Date Noted   Chronic pain of left knee 11/29/2022   Seasonal allergic rhinitis due to pollen 10/07/2022   Moderate persistent asthma 11/10/2018   Atopic dermatitis 11/10/2018    PCP: Dulce Sellar, NP  REFERRING PROVIDER:  Ralene Cork, DO - knee Dulce Sellar, NP - neck  REFERRING DIAG: Chronic pain of left knee  THERAPY DIAG:  Cervicalgia  Chronic pain of left knee  Muscle weakness (generalized)  Abnormal posture  Rationale for Evaluation and Treatment: Rehabilitation  ONSET DATE: February 2024   SUBJECTIVE:  SUBJECTIVE STATEMENT: Patient reports the left knee still hurts, mainly when sitting or standing too long in one position. She also reports continued left sided neck tightness that can cause headaches. Her left knee MRI was normal.  PAIN:  Are you having  pain? Yes:  NPRS scale: 4/10 Pain location: Left knee Pain description: Dull, aching, stabbing occasionally  Aggravating factors: Sitting, standing Relieving factors: Elevating the leg and using heat  Are you having pain? Yes:  NPRS scale: 5/10 Worst: 6/10 Pain location: Left side of neck Pain description: Tight, HA, deep ache Aggravating factors: sitting/teaching, nodding Relieving factors: Elevating the leg and using heat  PERTINENT HISTORY: See PMH above  PRECAUTIONS: None  PATIENT GOALS: Pain relief and return to prior level of function   OBJECTIVE:  PATIENT SURVEYS:  FOTO 44% functional status  01/21/23: 72%  EDEMA:  Patient does demonstrate left ankle edema compared to right  MUSCLE LENGTH: Patient demonstrates limitations of left hamstring, ITB, and calf   03/05/2023: slight limitation bilateral hamstrings that are equal with report of increased tightness on the left of posterior leg from ankle to hamstring   PALPATION: Tender to palpation lateral hamstring, calf, and posterior knee, midline quad tenderness, tenderness over lateral femoral epicondyle and ITB  Patellar mobility WFL but with pain medial glide  01/10/2023: tenderness noted L>R upper trap and bilateral suboccipital region  03/05/2023: tenderness noted left peroneals and proximal fibular region, lateral aspect of posterior knee  CERVICAL ROM:   Active ROM A/PROM (deg) eval   03/26/2023  Flexion    Extension    Right lateral  flexion    Left lateral flexion    Right rotation 62 70  Left rotation 65 70   (Blank rows = not tested)  LOWER EXTREMITY ROM:  Active ROM Right eval Left eval Left 03/05/2023  Hip flexion     Hip extension     Hip abduction     Hip adduction     Hip internal rotation     Hip external rotation     Knee flexion 140 140 140  Knee extension 5 hyper 5 hyper 5 hyper  Ankle dorsiflexion 12 8 10   Ankle plantarflexion   65  Ankle inversion   30  Ankle eversion   20    (Blank rows = not tested)  Patient reports posterior knee pain with left knee hyperextension  LOWER EXTREMITY MMT:  MMT Right eval Left eval Left 01/21/23 Left 03/05/2023  Hip flexion      Hip extension 4 4- 4-   Hip abduction 4 4- 4+   Hip adduction      Hip internal rotation      Hip external rotation      Knee flexion 5 4+  5  Knee extension 5 4+  5  Ankle dorsiflexion    5  Ankle plantarflexion    5  Ankle inversion    5  Ankle eversion    5   (Blank rows = not tested)  UPPER EXTREMITY MMT:  MMT Right 02/21/2023 Left 02/21/2023  Shoulder flexion    Shoulder extension    Shoulder abduction    Shoulder adduction    Shoulder extension    Shoulder internal rotation    Shoulder external rotation 5 4+  Middle trapezius 4 4-  Lower trapezius 4 4-  Elbow flexion    Elbow extension    Wrist flexion    Wrist extension    Wrist ulnar deviation    Wrist radial deviation    Wrist pronation    Wrist supination    Grip strength    (Blank rows = not tested)   FUNCTIONAL TESTS:  Squat: weight shift to the right, decreased depth, excessive forward knee translation, dynamic knee valgus  01/10/2023: DNF endurance: 13 seconds; cervical AROM grossly WFL but patient report neck tightness and cracking with cervical flexion and extension movements 01/31/2023: DNF endurance: 15 seconds   GAIT: Assistive device utilized: None Level of assistance: Complete Independence Comments: Antalgic on left   TODAY'S TREATMENT:         [facility] Adult PT Treatment:                                                DATE: 03/26/23 Therapeutic Exercise: Elliptical L5 R1 x 5 min while taking subjective Supine cervical retraction with towel roll 2 x 10 with 3 sec hold Prone I with palms down x 10 with 3 sec hold Prone T and Y x 10 with 3 sec hold each Sidelying thoracic rotation x 10 each Longsitting ankle eversion with green 2 x 15 on left SL heel raise 2 x 10 on left SLS on Airex 2 x 30 sec on  left Manual Therpy: Skilled palpation and monitoring of muscle tension while performing TPDN STM left upper trap and neck region Trigger Point Dry Needling Treatment: Pre-treatment instruction: Patient instructed on dry needling rationale, procedures, and possible side effects including pain during  treatment (achy,cramping feeling), bruising, drop of blood, lightheadedness, nausea, sweating. Patient Consent Given: No Education handout provided: No Muscles treated: Left upper trap, levator scap, and suboccipitals Needle size and number: .30x78mm x 2, .30x58mm x 1 Electrical stimulation performed: No Parameters: N/A Treatment response/outcome: Twitch response elicited and Palpable decrease in muscle tension Post-treatment instructions: Patient instructed to expect possible mild to moderate muscle soreness later today and/or tomorrow. Patient instructed in methods to reduce muscle soreness and to continue prescribed HEP. If patient was dry needled over the lung field, patient was instructed on signs and symptoms of pneumothorax and, however unlikely, to see immediate medical attention should they occur. Patient was also educated on signs and symptoms of infection and to seek medical attention should they occur. Patient verbalized understanding of these instructions and education.    [facility] Adult PT Treatment:                                                DATE: 03/14/23 Therapeutic Exercise: Rec bike lvl 2 x 3 min while taking subjective Supine QS x 10 - 5" hold Supine SLR 2x10 2# S/L hip abd 2x10 2# Bridge with GTB 2x10  S/L clamshell x 15 GTB each L ankle ev 2x10 RTB Manual Therpy: Skilled palpation and monitoring of muscle tension while performing TPDN STP / TPDN L lateral LE Trigger Point Dry Needling Treatment: Pre-treatment instruction: Patient instructed on dry needling rationale, procedures, and possible side effects including pain during treatment (achy,cramping feeling), bruising,  drop of blood, lightheadedness, nausea, sweating. Patient Consent Given: No Education handout provided: No Muscles treated: Left peroneal longus, L lateral gastroc/soleus Needle size and number: .30x31mm x 2; .30x55mm x 1 Electrical stimulation performed: No Parameters: N/A Treatment response/outcome: Twitch response elicited and Palpable decrease in muscle tension Post-treatment instructions: Patient instructed to expect possible mild to moderate muscle soreness later today and/or tomorrow. Patient instructed in methods to reduce muscle soreness and to continue prescribed HEP. If patient was dry needled over the lung field, patient was instructed on signs and symptoms of pneumothorax and, however unlikely, to see immediate medical attention should they occur. Patient was also educated on signs and symptoms of infection and to seek medical attention should they occur. Patient verbalized understanding of these instructions and education.   [facility] Adult PT Treatment:                                                DATE: 03/05/23 Therapeutic Exercise: NuStep L5 x 5 min with UE/LE while taking subjective Supine sciatic nerve floss Supine peronea nerve floss Instruction on SMFR for peroneals Therapeutic Activity: Objective measures and reassessment of left knee/leg symptoms Manual Therpy: Skilled palpation and monitoring of muscle tension while performing TPDN STP / TPDN left upper trap and posterior cuff region Trigger Point Dry Needling Treatment: Pre-treatment instruction: Patient instructed on dry needling rationale, procedures, and possible side effects including pain during treatment (achy,cramping feeling), bruising, drop of blood, lightheadedness, nausea, sweating. Patient Consent Given: No Education handout provided: No Muscles treated: Left peroneal longus Needle size and number: .30x63mm x 2 Electrical stimulation performed: No Parameters: N/A Treatment response/outcome: Twitch  response elicited and Palpable decrease in muscle tension Post-treatment instructions: Patient instructed  to expect possible mild to moderate muscle soreness later today and/or tomorrow. Patient instructed in methods to reduce muscle soreness and to continue prescribed HEP. If patient was dry needled over the lung field, patient was instructed on signs and symptoms of pneumothorax and, however unlikely, to see immediate medical attention should they occur. Patient was also educated on signs and symptoms of infection and to seek medical attention should they occur. Patient verbalized understanding of these instructions and education.   PATIENT EDUCATION:  Education details: HEP, TPDN Person educated: Patient Education method: Explanation, Demonstration, Tactile cues, Verbal cues Education comprehension: verbalized understanding, returned demonstration, verbal cues required, tactile cues required, and needs further education  HOME EXERCISE PROGRAM: Access Code: [email or web address]    ASSESSMENT: CLINICAL IMPRESSION: Patient tolerated therapy well with no adverse effects. Therapy focused on reducing cervical muscle tension, progressing postural control, and left ankle strengthening and stability with good tolerance. Performed TPDN for the left neck region with multiple twitch responses and patient reporting therapeutic benefit. Patient declined TPDN for the left lower leg this visit. She did not report any increased pain with exercises this visit. It is still unclear etiology of left knee and lower leg pain. She did exhibit improvement in cervical motion this visit. No changes to her HEP this visit. She was encouraged to avoid extended periods of static posture to reduce neck tension. Patient would benefit from continued skilled PT to progress her mobility and strength in order to reduce pain and maximize functional ability.    OBJECTIVE IMPAIRMENTS: Abnormal gait, decreased activity tolerance, decreased ROM,  decreased strength, impaired flexibility, and pain.   ACTIVITY LIMITATIONS: lifting, bending, sitting, standing, stairs, and locomotion level  PARTICIPATION LIMITATIONS: meal prep, cleaning, driving, shopping, community activity, and occupation  PERSONAL FACTORS: Past/current experiences and Time since onset of injury/illness/exacerbation are also affecting patient's functional outcome.    GOALS: Goals reviewed with patient? Yes  SHORT TERM GOALS: Target date: 01/03/2023  Patient will be I with initial HEP in order to progress with therapy. Baseline: HEP provided at eval 01/10/2023: independent Goal status: MET  2.  Patient will report left knee pain </= 2/10 in order to reduce functional limitations Baseline: 5/10 01/10/2023: 0/10 Goal status: MET  3.  Patient will demonstrate proper squat technique to return to gym exercise and strength left knee Baseline: patient exhibits deviations with squat technique 01/10/2023: continues to exhibit squat deviations Goal status: ONGOING  LONG TERM GOALS: Target date: 04/11/2023    Patient will be I with final HEP to maintain progress from PT. Baseline: HEP provided at eval Goal status: ONGOING  2.  Patient will report >/= 69% status on FOTO to indicate improved functional ability. Baseline: 44% functional status 01/21/23: 72% Goal status: MET  3.  Patient will demonstrate left hip strength >/= 4/5 MMT and knee strength 5/5 MMT to improve standing and walking tolerance Baseline: see limitations above 01/21/23: see chart Goal status: ONGOING  4.  Patient will report no limitations with standing for work related tasks in order to reduce functional limitations Baseline: patient reports limitations at work 01/21/23: 2-3 hours knee pain will increase (previously 45 minutes )  Goal status: ONGOING, improving  5.  Pt will improve FOTO (neck) function score to no less than 71% as proxy for functional improvement  Baseline: 59% function Goal  status: INITIAL  6.  Pt will improve bilateral cervical rotation to no less than 80 degrees for improved comfort with driving and school activities  Baseline: see ROM  chart Goal status: INITIAL   PLAN: PT FREQUENCY: 1x/week  PT DURATION: 6 weeks  PLANNED INTERVENTIONS: Therapeutic exercises, Therapeutic activity, Neuromuscular re-education, Balance training, Gait training, Patient/Family education, Self Care, Joint mobilization, Aquatic Therapy, Dry Needling, Electrical stimulation, Cryotherapy, Moist heat, Taping, Ionotophoresis 4mg /ml Dexamethasone, Manual therapy, and Re-evaluation  PLAN FOR NEXT SESSION: Review HEP and progress PRN, manual/TPDN for left neck and shoulder region, periscapular and rotator cuff strengthening, ulnar nerve glides, progress stretching and strengthening for the left hip and knee, squat mechanics   Rosana Hoes, PT, DPT, LAT, ATC 03/26/23  8:58 AM Phone: [phone] Fax: [phone]

## 2023-03-27 ENCOUNTER — Telehealth: Payer: Self-pay | Admitting: Diagnostic Neuroimaging

## 2023-03-27 NOTE — Telephone Encounter (Signed)
LVM and sent MyChart msg informing pt of r/s needed- MD out.

## 2023-04-02 ENCOUNTER — Ambulatory Visit: Payer: Medicaid Other

## 2023-04-02 DIAGNOSIS — G8929 Other chronic pain: Secondary | ICD-10-CM

## 2023-04-02 DIAGNOSIS — R293 Abnormal posture: Secondary | ICD-10-CM

## 2023-04-02 DIAGNOSIS — R002 Palpitations: Secondary | ICD-10-CM | POA: Diagnosis not present

## 2023-04-02 DIAGNOSIS — M542 Cervicalgia: Secondary | ICD-10-CM | POA: Diagnosis not present

## 2023-04-02 DIAGNOSIS — M6281 Muscle weakness (generalized): Secondary | ICD-10-CM

## 2023-04-02 NOTE — Therapy (Signed)
OUTPATIENT PHYSICAL THERAPY TREATMENT   Patient Name: Stacey Moss MRN: [id number] DOB:12-19-2000, 22 y.o.,, female Today's Date: 04/02/2023   END OF SESSION:  PT End of Session - 04/02/23 1449     Visit Number 11    Number of Visits 11    Date for PT Re-Evaluation 04/11/23    Authorization Type [employer]    Authorization - Number of Visits 27    PT Start Time 1450    PT Stop Time 1530    PT Time Calculation (min) 40 min    Activity Tolerance Patient tolerated treatment well    Behavior During Therapy [facility] for tasks assessed/performed                     Past Medical History:  Diagnosis Date   Allergic conjunctivitis 11/10/2018   Allergy    seasonal   Asthma    Bursitis of intermetatarsal bursa of left foot 09/18/2018   Eczema    History of food allergy 11/10/2018   Perennial allergic rhinitis 03/21/2015   Reactive airway disease 10/29/2018   Symptom of wheezing 10/29/2018   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   Urticaria    Well child visit 03/21/2015   Past Surgical History:  Procedure Laterality Date   WISDOM TOOTH EXTRACTION     Patient Active Problem List   Diagnosis Date Noted   Chronic pain of left knee 11/29/2022   Seasonal allergic rhinitis due to pollen 10/07/2022   Moderate persistent asthma 11/10/2018   Atopic dermatitis 11/10/2018    PCP: Dulce Sellar, NP  REFERRING PROVIDER:  Ralene Cork, DO - knee Dulce Sellar, NP - neck  REFERRING DIAG: Chronic pain of left knee  THERAPY DIAG:  Cervicalgia  Chronic pain of left knee  Muscle weakness (generalized)  Abnormal posture  Rationale for Evaluation and Treatment: Rehabilitation  ONSET DATE: February 2024   SUBJECTIVE:  SUBJECTIVE STATEMENT: Pt presents to PT with reports of neck pain and HA symptoms. L knee is feeling better today.   PAIN:  Are you having pain? Yes:  NPRS scale: 1/10 Pain location: Left knee Pain description: Dull, aching, stabbing occasionally   Aggravating factors: Sitting, standing Relieving factors: Elevating the leg and using heat  Are you having pain? Yes:  NPRS scale: 5/10 Worst: 6/10 Pain location: Left side of neck Pain description: Tight, HA, deep ache Aggravating factors: sitting/teaching, nodding Relieving factors: Elevating the leg and using heat  PERTINENT HISTORY: See PMH above  PRECAUTIONS: None  PATIENT GOALS: Pain relief and return to prior level of function   OBJECTIVE:  PATIENT SURVEYS:  FOTO 44% functional status  01/21/23: 72%  EDEMA:  Patient does demonstrate left ankle edema compared to right  MUSCLE LENGTH: Patient demonstrates limitations of left hamstring, ITB, and calf   03/05/2023: slight limitation bilateral hamstrings that are equal with report of increased tightness on the left of posterior leg from ankle to hamstring   PALPATION: Tender to palpation lateral hamstring, calf, and posterior knee, midline quad tenderness, tenderness over lateral femoral epicondyle and ITB  Patellar mobility WFL but with pain medial glide  01/10/2023: tenderness noted L>R upper trap and bilateral suboccipital region  03/05/2023: tenderness noted left peroneals and proximal fibular region, lateral aspect of posterior knee  CERVICAL ROM:   Active ROM A/PROM (deg) eval   03/26/2023  Flexion    Extension    Right lateral flexion    Left lateral flexion    Right rotation 62 70  Left rotation 65 70   (Blank rows = not tested)  LOWER EXTREMITY ROM:  Active ROM Right eval Left eval Left 03/05/2023  Hip flexion     Hip extension     Hip abduction     Hip adduction     Hip internal rotation     Hip external rotation     Knee flexion 140 140 140  Knee extension 5 hyper 5 hyper 5 hyper  Ankle dorsiflexion 12 8 10   Ankle plantarflexion   65  Ankle inversion   30  Ankle eversion   20   (Blank rows = not tested)  Patient reports posterior knee pain with left knee hyperextension  LOWER  EXTREMITY MMT:  MMT Right eval Left eval Left 01/21/23 Left 03/05/2023  Hip flexion      Hip extension 4 4- 4-   Hip abduction 4 4- 4+   Hip adduction      Hip internal rotation      Hip external rotation      Knee flexion 5 4+  5  Knee extension 5 4+  5  Ankle dorsiflexion    5  Ankle plantarflexion    5  Ankle inversion    5  Ankle eversion    5   (Blank rows = not tested)  UPPER EXTREMITY MMT:  MMT Right 02/21/2023 Left 02/21/2023  Shoulder flexion    Shoulder extension    Shoulder abduction    Shoulder adduction    Shoulder extension    Shoulder internal rotation    Shoulder external rotation 5 4+  Middle trapezius 4 4-  Lower trapezius 4 4-  Elbow flexion    Elbow extension    Wrist flexion    Wrist extension    Wrist ulnar deviation    Wrist radial deviation    Wrist pronation    Wrist supination    Grip strength    (Blank rows = not tested)   FUNCTIONAL TESTS:  Squat: weight shift to the right, decreased depth, excessive forward knee translation, dynamic knee valgus  01/10/2023: DNF endurance: 13 seconds; cervical AROM grossly WFL but patient report neck tightness and cracking with cervical flexion and extension movements 01/31/2023: DNF endurance: 15 seconds   GAIT: Assistive device utilized: None Level of assistance: Complete Independence Comments: Antalgic on left   TODAY'S TREATMENT:         [facility] Adult PT Treatment:                                                DATE: 04/02/23 Therapeutic Exercise: Elliptical L5 R1 x 5 min while taking subjective Supine chin tuck 2x10  POE chin tuck x 10 Prone I with palms down 2x10 with 3 sec hold Prone T and Y x 10 with 3 sec hold each Cat Cow x 15 Longsitting ankle inv.ev 2x15 GTB SLS on airex 2x30" L Eccentric heel raise 2x15 Manual Therpy: Skilled palpation and monitoring of muscle tension while performing TPDN STM left upper trap and neck region Trigger Point Dry Needling Treatment: Pre-treatment  instruction: Patient instructed on dry needling rationale, procedures, and possible side effects including pain during treatment (achy,cramping feeling), bruising, drop of blood, lightheadedness, nausea, sweating. Patient Consent Given: No Education handout provided: No Muscles treated: Left upper trap, levator scap, and suboccipitals Needle size and number: .30x70mm x 2, .  30x42mm x 1 Electrical stimulation performed: No Parameters: N/A Treatment response/outcome: Twitch response elicited and Palpable decrease in muscle tension Post-treatment instructions: Patient instructed to expect possible mild to moderate muscle soreness later today and/or tomorrow. Patient instructed in methods to reduce muscle soreness and to continue prescribed HEP. If patient was dry needled over the lung field, patient was instructed on signs and symptoms of pneumothorax and, however unlikely, to see immediate medical attention should they occur. Patient was also educated on signs and symptoms of infection and to seek medical attention should they occur. Patient verbalized understanding of these instructions and education.   [facility] Adult PT Treatment:                                                DATE: 03/26/23 Therapeutic Exercise: Elliptical L5 R1 x 5 min while taking subjective Supine cervical retraction with towel roll 2 x 10 with 3 sec hold Prone I with palms down x 10 with 3 sec hold Prone T and Y x 10 with 3 sec hold each Sidelying thoracic rotation x 10 each Longsitting ankle eversion with green 2 x 15 on left SL heel raise 2 x 10 on left SLS on Airex 2 x 30 sec on left Manual Therpy: Skilled palpation and monitoring of muscle tension while performing TPDN STM left upper trap and neck region Trigger Point Dry Needling Treatment: Pre-treatment instruction: Patient instructed on dry needling rationale, procedures, and possible side effects including pain during treatment (achy,cramping feeling), bruising, drop  of blood, lightheadedness, nausea, sweating. Patient Consent Given: No Education handout provided: No Muscles treated: Left upper trap, levator scap, and suboccipitals Needle size and number: .30x77mm x 2, .30x37mm x 1 Electrical stimulation performed: No Parameters: N/A Treatment response/outcome: Twitch response elicited and Palpable decrease in muscle tension Post-treatment instructions: Patient instructed to expect possible mild to moderate muscle soreness later today and/or tomorrow. Patient instructed in methods to reduce muscle soreness and to continue prescribed HEP. If patient was dry needled over the lung field, patient was instructed on signs and symptoms of pneumothorax and, however unlikely, to see immediate medical attention should they occur. Patient was also educated on signs and symptoms of infection and to seek medical attention should they occur. Patient verbalized understanding of these instructions and education.    [facility] Adult PT Treatment:                                                DATE: 03/14/23 Therapeutic Exercise: Rec bike lvl 2 x 3 min while taking subjective Supine QS x 10 - 5" hold Supine SLR 2x10 2# S/L hip abd 2x10 2# Bridge with GTB 2x10  S/L clamshell x 15 GTB each L ankle ev 2x10 RTB Manual Therpy: Skilled palpation and monitoring of muscle tension while performing TPDN STP / TPDN L lateral LE Trigger Point Dry Needling Treatment: Pre-treatment instruction: Patient instructed on dry needling rationale, procedures, and possible side effects including pain during treatment (achy,cramping feeling), bruising, drop of blood, lightheadedness, nausea, sweating. Patient Consent Given: No Education handout provided: No Muscles treated: Left peroneal longus, L lateral gastroc/soleus Needle size and number: .30x39mm x 2; .30x84mm x 1 Electrical stimulation performed: No Parameters: N/A Treatment response/outcome:  Twitch response elicited and Palpable decrease  in muscle tension Post-treatment instructions: Patient instructed to expect possible mild to moderate muscle soreness later today and/or tomorrow. Patient instructed in methods to reduce muscle soreness and to continue prescribed HEP. If patient was dry needled over the lung field, patient was instructed on signs and symptoms of pneumothorax and, however unlikely, to see immediate medical attention should they occur. Patient was also educated on signs and symptoms of infection and to seek medical attention should they occur. Patient verbalized understanding of these instructions and education.   [facility] Adult PT Treatment:                                                DATE: 03/05/23 Therapeutic Exercise: NuStep L5 x 5 min with UE/LE while taking subjective Supine sciatic nerve floss Supine peronea nerve floss Instruction on SMFR for peroneals Therapeutic Activity: Objective measures and reassessment of left knee/leg symptoms Manual Therpy: Skilled palpation and monitoring of muscle tension while performing TPDN STP / TPDN left upper trap and posterior cuff region Trigger Point Dry Needling Treatment: Pre-treatment instruction: Patient instructed on dry needling rationale, procedures, and possible side effects including pain during treatment (achy,cramping feeling), bruising, drop of blood, lightheadedness, nausea, sweating. Patient Consent Given: No Education handout provided: No Muscles treated: Left peroneal longus Needle size and number: .30x32mm x 2 Electrical stimulation performed: No Parameters: N/A Treatment response/outcome: Twitch response elicited and Palpable decrease in muscle tension Post-treatment instructions: Patient instructed to expect possible mild to moderate muscle soreness later today and/or tomorrow. Patient instructed in methods to reduce muscle soreness and to continue prescribed HEP. If patient was dry needled over the lung field, patient was instructed on signs and  symptoms of pneumothorax and, however unlikely, to see immediate medical attention should they occur. Patient was also educated on signs and symptoms of infection and to seek medical attention should they occur. Patient verbalized understanding of these instructions and education.   PATIENT EDUCATION:  Education details: HEP, TPDN Person educated: Patient Education method: Explanation, Demonstration, Tactile cues, Verbal cues Education comprehension: verbalized understanding, returned demonstration, verbal cues required, tactile cues required, and needs further education  HOME EXERCISE PROGRAM: Access Code: [email or web address]    ASSESSMENT: CLINICAL IMPRESSION: Pt was able to complete all prescribed exercises with no adverse effect. Therapy today focused on strengthening of L LE and periscapular/DNF musculature. Responded well to TPDN, noting decreased neck pain and HA symptoms post. Continues to benefit from skilled PT services.    OBJECTIVE IMPAIRMENTS: Abnormal gait, decreased activity tolerance, decreased ROM, decreased strength, impaired flexibility, and pain.   ACTIVITY LIMITATIONS: lifting, bending, sitting, standing, stairs, and locomotion level  PARTICIPATION LIMITATIONS: meal prep, cleaning, driving, shopping, community activity, and occupation  PERSONAL FACTORS: Past/current experiences and Time since onset of injury/illness/exacerbation are also affecting patient's functional outcome.    GOALS: Goals reviewed with patient? Yes  SHORT TERM GOALS: Target date: 01/03/2023  Patient will be I with initial HEP in order to progress with therapy. Baseline: HEP provided at eval 01/10/2023: independent Goal status: MET  2.  Patient will report left knee pain </= 2/10 in order to reduce functional limitations Baseline: 5/10 01/10/2023: 0/10 Goal status: MET  3.  Patient will demonstrate proper squat technique to return to gym exercise and strength left knee Baseline: patient exhibits  deviations with squat technique 01/10/2023:  continues to exhibit squat deviations Goal status: ONGOING  LONG TERM GOALS: Target date: 04/11/2023    Patient will be I with final HEP to maintain progress from PT. Baseline: HEP provided at eval Goal status: ONGOING  2.  Patient will report >/= 69% status on FOTO to indicate improved functional ability. Baseline: 44% functional status 01/21/23: 72% Goal status: MET  3.  Patient will demonstrate left hip strength >/= 4/5 MMT and knee strength 5/5 MMT to improve standing and walking tolerance Baseline: see limitations above 01/21/23: see chart Goal status: ONGOING  4.  Patient will report no limitations with standing for work related tasks in order to reduce functional limitations Baseline: patient reports limitations at work 01/21/23: 2-3 hours knee pain will increase (previously 45 minutes )  Goal status: ONGOING, improving  5.  Pt will improve FOTO (neck) function score to no less than 71% as proxy for functional improvement  Baseline: 59% function Goal status: INITIAL  6.  Pt will improve bilateral cervical rotation to no less than 80 degrees for improved comfort with driving and school activities  Baseline: see ROM chart Goal status: INITIAL   PLAN: PT FREQUENCY: 1x/week  PT DURATION: 6 weeks  PLANNED INTERVENTIONS: Therapeutic exercises, Therapeutic activity, Neuromuscular re-education, Balance training, Gait training, Patient/Family education, Self Care, Joint mobilization, Aquatic Therapy, Dry Needling, Electrical stimulation, Cryotherapy, Moist heat, Taping, Ionotophoresis 4mg /ml Dexamethasone, Manual therapy, and Re-evaluation  PLAN FOR NEXT SESSION: Review HEP and progress PRN, manual/TPDN for left neck and shoulder region, periscapular and rotator cuff strengthening, ulnar nerve glides, progress stretching and strengthening for the left hip and knee, squat mechanics  Eloy End PT  04/02/23 4:25 PM

## 2023-04-07 ENCOUNTER — Ambulatory Visit (INDEPENDENT_AMBULATORY_CARE_PROVIDER_SITE_OTHER): Payer: Medicaid Other | Admitting: Neurology

## 2023-04-07 ENCOUNTER — Encounter: Payer: Self-pay | Admitting: Family Medicine

## 2023-04-07 ENCOUNTER — Other Ambulatory Visit (HOSPITAL_BASED_OUTPATIENT_CLINIC_OR_DEPARTMENT_OTHER): Payer: Self-pay

## 2023-04-07 ENCOUNTER — Ambulatory Visit (INDEPENDENT_AMBULATORY_CARE_PROVIDER_SITE_OTHER): Payer: Medicaid Other | Admitting: Family Medicine

## 2023-04-07 VITALS — BP 114/74 | HR 87 | Ht 66.0 in | Wt 144.5 lb

## 2023-04-07 VITALS — BP 123/86 | HR 103 | Ht 66.0 in | Wt 145.0 lb

## 2023-04-07 DIAGNOSIS — L0291 Cutaneous abscess, unspecified: Secondary | ICD-10-CM

## 2023-04-07 DIAGNOSIS — G43709 Chronic migraine without aura, not intractable, without status migrainosus: Secondary | ICD-10-CM | POA: Diagnosis not present

## 2023-04-07 DIAGNOSIS — F419 Anxiety disorder, unspecified: Secondary | ICD-10-CM | POA: Diagnosis not present

## 2023-04-07 MED ORDER — SUMATRIPTAN SUCCINATE 50 MG PO TABS: 50.0000 mg | ORAL_TABLET | ORAL | 6 refills | Status: AC | PRN

## 2023-04-07 MED ORDER — DOXYCYCLINE HYCLATE 100 MG PO TABS
100.0000 mg | ORAL_TABLET | Freq: Two times a day (BID) | ORAL | 0 refills | Status: DC
Start: 2023-04-07 — End: 2023-04-12
  Filled 2023-04-07: qty 10, 5d supply, fill #0

## 2023-04-07 NOTE — Progress Notes (Signed)
Chief Complaint  Patient presents with   New Patient (Initial Visit)    Rm 14. Patient alone, Still having daily headaches, but not as bad as when referral came. Patient reports, naproxen did not help, but will eat something salty or drink a soda and that seems to help sometimes. Reports 15 headache days out of 30 but can report that most dont last all day, they are either first thing in the morning or right before bed at night.    ASSESSMENT AND PLAN  Stacey Moss is a 22 y.o. female   Chronic migraine Anxiety  Patient will see psychiatrist for better management of her anxiety, does not want to take daily medications for her migraine at this point,  Normal neurological examination, extensive imaging study including CT angiogram of head and neck and CT venogram  Imitrex as needed  Also suggested neck stretching, warm compression  Continue follow-up with primary care return to clinic for new issues DIAGNOSTIC DATA (LABS, IMAGING, TESTING) - I reviewed patient records, labs, notes, testing and imaging myself where available.   MEDICAL HISTORY:  Stacey Moss, is a 22 year old female seen in request by her primary care nurse practitioner Hyman Hopes for evaluation of headache,  History is obtained from the patient and review of electronic medical records. I personally reviewed pertinent available imaging films in PACS.   PMHx of   She began to have a headache since June 2024, described left frontal pressure headaches, can last hours or longer, during intense headache she described light noise sensitivity, no nausea, no visual change, mother suffered migraine  She has been taking frequent naproxen initially, now her headache has much improved with physical therapy, dry needling for her neck pain  She had a CT angiogram head and neck with without contrast in August 2024 for evaluation of headache, that was normal  CT venogram was normal  She complains of anxiety, is seeking  help  PHYSICAL EXAM:   Vitals:   04/07/23 0827  BP: 114/74  Pulse: 87  Weight: 144 lb 8 oz (65.5 kg)  Height: 5\' 6"  (1.676 m)   Not recorded     Body mass index is 23.32 kg/m.  PHYSICAL EXAMNIATION:  Gen: NAD, conversant, well nourised, well groomed                     Cardiovascular: Regular rate rhythm, no peripheral edema, warm, nontender. Eyes: Conjunctivae clear without exudates or hemorrhage Neck: Supple, no carotid bruits. Pulmonary: Clear to auscultation bilaterally   NEUROLOGICAL EXAM:  MENTAL STATUS: Speech/cognition: Awake, alert, oriented to history taking and casual conversation CRANIAL NERVES: CN II: Visual fields are full to confrontation. Pupils are round equal and briskly reactive to light.  Funduscopic examination with normal bilaterally CN III, IV, VI: extraocular movement are normal. No ptosis. CN V: Facial sensation is intact to light touch CN VII: Face is symmetric with normal eye closure  CN VIII: Hearing is normal to causal conversation. CN IX, X: Phonation is normal. CN XI: Head turning and shoulder shrug are intact  MOTOR: There is no pronator drift of out-stretched arms. Muscle bulk and tone are normal. Muscle strength is normal.  REFLEXES: Reflexes are 2+ and symmetric at the biceps, triceps, knees, and ankles. Plantar responses are flexor.  SENSORY: Intact to light touch, pinprick and vibratory sensation are intact in fingers and toes.  COORDINATION: There is no trunk or limb dysmetria noted.  GAIT/STANCE: Posture is normal. Gait is steady with  normal steps, base, arm swing, and turning. Heel and toe walking are normal. Tandem gait is normal.  Romberg is absent.  REVIEW OF SYSTEMS:  Full 14 system review of systems performed and notable only for as above All other review of systems were negative.   ALLERGIES: Allergies  Allergen Reactions   Justicia Adhatoda (Malabar Nut Tree) [Justicia Adhatoda] Hives   Other     TREE  NUTS   Augmentin [Amoxicillin-Pot Clavulanate] Rash    HOME MEDICATIONS: Current Outpatient Medications  Medication Sig Dispense Refill   cetirizine (ZYRTEC) 10 MG tablet Take 10 mg by mouth daily.     Lactic Ac-Citric Ac-Pot Bitart (PHEXXI) 1.8-1-0.4 % GEL Place 5 g vaginally as directed. Before intercourse 120 g 11   naproxen (NAPROSYN) 500 MG tablet Take 1 tablet (500 mg total) by mouth 2 (two) times daily with a meal. 60 tablet 2   Probiotic Product (PROBIOTIC PO) Take by mouth.     No current facility-administered medications for this visit.    PAST MEDICAL HISTORY: Past Medical History:  Diagnosis Date   Allergic conjunctivitis 11/10/2018   Allergy    seasonal   Asthma    Bursitis of intermetatarsal bursa of left foot 09/18/2018   Eczema    History of food allergy 11/10/2018   Perennial allergic rhinitis 03/21/2015   Reactive airway disease 10/29/2018   Symptom of wheezing 10/29/2018   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   Urticaria    Well child visit 03/21/2015    PAST SURGICAL HISTORY: Past Surgical History:  Procedure Laterality Date   WISDOM TOOTH EXTRACTION      FAMILY HISTORY: Family History  Problem Relation Age of Onset   Arthritis Mother    Asthma Mother    Allergic rhinitis Brother    Breast cancer Maternal Aunt    Asthma Maternal Grandmother    Asthma Maternal Grandfather    Cancer Paternal Grandmother        breast   Breast cancer Paternal Grandmother 34    SOCIAL HISTORY: Social History   Socioeconomic History   Marital status: Single    Spouse name: Not on file   Number of children: Not on file   Years of education: Not on file   Highest education level: Bachelor's degree (e.g., BA, AB, BS)  Occupational History   Not on file  Tobacco Use   Smoking status: Never    Passive exposure: Past   Smokeless tobacco: Never  Vaping Use   Vaping status: Never Used  Substance and Sexual Activity   Alcohol use: Not Currently    Comment: occ   Drug  use: No   Sexual activity: Yes    Partners: Male    Birth control/protection: None, Condom    Comment: 1st intercourse- 16y.o, partners- 1  Other Topics Concern   Not on file  Social History Narrative   Not on file   Social Determinants of Health   Financial Resource Strain: Low Risk  (11/21/2022)   Overall Financial Resource Strain (CARDIA)    Difficulty of Paying Living Expenses: Not very hard  Food Insecurity: No Food Insecurity (11/21/2022)   Hunger Vital Sign    Worried About Running Out of Food in the Last Year: Never true    Ran Out of Food in the Last Year: Never true  Transportation Needs: No Transportation Needs (11/21/2022)   PRAPARE - Administrator, Civil Service (Medical): No    Lack of Transportation (Non-Medical): No  Physical Activity:  Insufficiently Active (11/21/2022)   Exercise Vital Sign    Days of Exercise per Week: 4 days    Minutes of Exercise per Session: 30 min  Stress: Not on file  Social Connections: Moderately Integrated (11/21/2022)   Social Connection and Isolation Panel [NHANES]    Frequency of Communication with Friends and Family: More than three times a week    Frequency of Social Gatherings with Friends and Family: More than three times a week    Attends Religious Services: Never    Database administrator or Organizations: Yes    Attends Banker Meetings: 1 to 4 times per year    Marital Status: Living with partner  Intimate Partner Violence: Unknown (09/07/2021)   Received from [employer], [facility]   HITS    Physically Hurt: Not on file    Insult or Talk Down To: Not on file    Threaten Physical Harm: Not on file    Scream or Curse: Not on file      Levert Feinstein, M.D. Ph.D.  [facility] [address] [place], [place] [address] Ph: [phone] Fax: [phone]  CC:  Clayborne Dana, NP [address] [place],  [place] [address]  Clayborne Dana, NP

## 2023-04-07 NOTE — Progress Notes (Signed)
   Acute Office Visit  Subjective:     Patient ID: Stacey Moss, female    DOB: July 16, 2000, 22 y.o.   MRN: [id number]  Chief Complaint  Patient presents with   Insect Bite    HPI Patient is in today for skin lesion.   Discussed the use of AI scribe software for clinical note transcription with the patient, who gave verbal consent to proceed.  History of Present Illness   The patient presents with a skin lesion on the arm, first noticed approximately two weeks ago after attending an outdoor concert. The patient initially attributed the lesion to a possible insect bite or irritation from a DIY dress worn at the concert. The lesion, initially resembling a bug bite, has progressively worsened over the past two weeks. The patient reports that the lesion became painful over the past weekend. The lesion is described as tender, swollen, red, and blister-like, with possible oozing. The patient attempted self-treatment with a blister bandaid, which seemed to exacerbate the condition, and Neosporin, which seemed to alleviate some pain. The patient has also been washing the area with antibacterial soap. The patient denies any systemic symptoms such as tingling or radiation of pain down the arm. The patient has a history of a severe reaction to a bee sting, which resulted in a blistered lesion lasting for two months. The patient also reports a previous allergic reaction to Augmentin.            ROS All review of systems negative except what is listed in the HPI      Objective:    BP 123/86   Pulse (!) 103   Ht 5\' 6"  (1.676 m)   Wt 145 lb (65.8 kg)   LMP 03/10/2023   SpO2 100%   BMI 23.40 kg/m    Physical Exam Vitals reviewed.  Constitutional:      Appearance: Normal appearance.  Skin:    General: Skin is warm and dry.     Comments: Small area of induration, warmth, erythema, tenderness, scant drainage  Neurological:     Mental Status: She is alert and oriented to person, place,  and time.  Psychiatric:        Behavior: Behavior normal.       No results found for any visits on 04/07/23.      Assessment & Plan:   Problem List Items Addressed This Visit   None Visit Diagnoses     Abscess    -  Primary   Relevant Medications   doxycycline (VIBRA-TABS) 100 MG tablet     Tender, swollen, red lesion on the arm with scant drainage. Likely secondary to a bite or ingrown hair, exacerbated by irritation from bandage adhesive. -Start Doxycycline twice daily for 5 days. -Apply warm compresses 3-4 times daily. -Continue washing with antibacterial wash twice daily. Use gauze dressing -Check progress at the end of the week or sooner if redness, swelling, or pain worsens.        Meds ordered this encounter  Medications   doxycycline (VIBRA-TABS) 100 MG tablet    Sig: Take 1 tablet (100 mg total) by mouth 2 (two) times daily for 5 days.    Dispense:  10 tablet    Refill:  0    Order Specific Question:   Supervising Provider    Answer:   Danise Edge A [[id number]]    Return if symptoms worsen or fail to improve.  Clayborne Dana, NP

## 2023-04-08 ENCOUNTER — Encounter: Payer: Self-pay | Admitting: Family Medicine

## 2023-04-09 ENCOUNTER — Encounter: Payer: Medicaid Other | Admitting: Physical Therapy

## 2023-04-09 ENCOUNTER — Ambulatory Visit: Payer: Medicaid Other

## 2023-04-10 ENCOUNTER — Ambulatory Visit: Payer: Medicaid Other | Admitting: Family Medicine

## 2023-04-10 ENCOUNTER — Encounter: Payer: Self-pay | Admitting: Family Medicine

## 2023-04-10 ENCOUNTER — Other Ambulatory Visit (HOSPITAL_BASED_OUTPATIENT_CLINIC_OR_DEPARTMENT_OTHER): Payer: Self-pay

## 2023-04-10 DIAGNOSIS — L0291 Cutaneous abscess, unspecified: Secondary | ICD-10-CM | POA: Diagnosis not present

## 2023-04-10 MED ORDER — DOXYCYCLINE HYCLATE 100 MG PO TABS
100.0000 mg | ORAL_TABLET | Freq: Two times a day (BID) | ORAL | 0 refills | Status: AC
Start: 2023-04-10 — End: 2023-04-15
  Filled 2023-04-10: qty 10, 5d supply, fill #0

## 2023-04-10 NOTE — Progress Notes (Signed)
Acute Office Visit  Subjective:     Patient ID: Stacey Moss, female    DOB: Nov 25, 2000, 22 y.o.   MRN: [id number]  Chief Complaint  Patient presents with   Skin Problem    HPI Patient is in today for skin follow-up    Discussed the use of AI scribe software for clinical note transcription with the patient, who gave verbal consent to proceed.  History of Present Illness   The patient presents for a follow-up visit regarding skin lesion. She reports that the lesion has been causing discomfort, particularly when stretching the affected area. The lesion is described as red and swollen, with a sensation of pressure, similar to previous cysts she has experienced in the groin area. The patient has been adhering to a regimen of daily cleaning and antibiotic treatment, and has attempted to use warm compresses, though she expresses uncertainty about the effectiveness of these compresses due to rapid cooling.  The patient has a history of cystic acne, which she manages with a PanOxyl wash. She also reports occasional cysts in the groin area, typically occurring before her menstrual period. These cysts have not been a significant concern in the past, as they tend to resolve on their own.  The patient has been diligent in her care of the lesion, changing the location of the tape used to secure the dressing daily to avoid skin irritation. She expresses some anxiety about the lesion, particularly given her upcoming engagement party and conference attendance. Despite this, she has been proactive in seeking care and following treatment recommendations.         ROS All review of systems negative except what is listed in the HPI      Objective:    BP 119/76   Pulse 90   Ht 5\' 6"  (1.676 m)   Wt 145 lb (65.8 kg)   LMP 03/10/2023   SpO2 100%   BMI 23.40 kg/m    Physical Exam Vitals reviewed.  Constitutional:      Appearance: Normal appearance.  Skin:    General: Skin is warm and dry.      Comments: Area is starting to improving; still with some erythema and induration, drainage on dressing   Neurological:     Mental Status: She is alert and oriented to person, place, and time.  Psychiatric:        Behavior: Behavior normal.        Thought Content: Thought content normal.       No results found for any visits on 04/10/23.      Assessment & Plan:   Problem List Items Addressed This Visit   None Visit Diagnoses     Abscess       Relevant Medications   doxycycline (VIBRA-TABS) 100 MG tablet      Improvement noted with antibiotics, but still present. Patient reports pain with pressure and soreness. Possible history of similar cysts in the groin area.   -Extend antibiotics for an additional 3-5 days, with the option to stop after 2-3 days if significant improvement is noted.   -Continue warm compresses.   -Use antibacterial soap for cleaning the area.   -Consider dermatology referral if the issue persists or recurs frequently, especially since given history of similar groin lesions.        Meds ordered this encounter  Medications   doxycycline (VIBRA-TABS) 100 MG tablet    Sig: Take 1 tablet (100 mg total) by mouth 2 (two) times daily for 5  days.    Dispense:  10 tablet    Refill:  0    Order Specific Question:   Supervising Provider    Answer:   Danise Edge A [[id number]]    Return if symptoms worsen or fail to improve.  Clayborne Dana, NP

## 2023-04-16 ENCOUNTER — Ambulatory Visit (HOSPITAL_BASED_OUTPATIENT_CLINIC_OR_DEPARTMENT_OTHER)
Admission: RE | Admit: 2023-04-16 | Discharge: 2023-04-16 | Disposition: A | Discharge status: Home / Self Care | Payer: Medicaid Other | Source: Ambulatory Visit | Attending: Family Medicine | Admitting: Family Medicine

## 2023-04-16 ENCOUNTER — Ambulatory Visit (INDEPENDENT_AMBULATORY_CARE_PROVIDER_SITE_OTHER): Payer: Medicaid Other | Admitting: Family Medicine

## 2023-04-16 ENCOUNTER — Encounter: Payer: Self-pay | Admitting: Family Medicine

## 2023-04-16 VITALS — BP 125/81 | HR 94 | Ht 66.0 in | Wt 145.0 lb

## 2023-04-16 DIAGNOSIS — M542 Cervicalgia: Secondary | ICD-10-CM

## 2023-04-16 MED ORDER — CYCLOBENZAPRINE HCL 5 MG PO TABS
5.0000 mg | ORAL_TABLET | Freq: Three times a day (TID) | ORAL | 1 refills | Status: AC | PRN
Start: 2023-04-16 — End: ?

## 2023-04-16 MED ORDER — MELOXICAM 15 MG PO TABS
15.0000 mg | ORAL_TABLET | Freq: Every day | ORAL | 0 refills | Status: AC
Start: 2023-04-16 — End: ?

## 2023-04-16 MED ORDER — PREDNISONE 20 MG PO TABS
20.0000 mg | ORAL_TABLET | Freq: Every day | ORAL | 0 refills | Status: AC
Start: 2023-04-16 — End: 2023-04-21

## 2023-04-16 NOTE — Progress Notes (Signed)
Acute Office Visit  Subjective:     Patient ID: Stacey Moss, female    DOB: 20-Oct-2000, 22 y.o.   MRN: [id number]  Chief Complaint  Patient presents with   Neck Pain    Neck Pain    Patient is in today for neck pain.   Discussed the use of AI scribe software for clinical note transcription with the patient, who gave verbal consent to proceed.  History of Present Illness   The patient, with a history of left-sided neck pain and headaches, reports a recent exacerbation of symptoms. They describe the pain as radiating from the back of the shoulder to the front of the head, with associated headaches. The patient has been receiving physical therapy and chiropractic care for several months, with limited relief.  The patient reports daily discomfort, varying in intensity, sometimes reaching an 8 or 9 out of 10. The pain is described as either a soreness in the left neck and shoulder or a shooting pain from the back of the neck to the front. Accompanying headaches are described as a consistent throbbing pain. The patient notes that the neck pain is always present, with headaches occurring when the neck pain is severe.  The patient also reports occasional soreness in the arm, but denies any numbness or tingling. They have noticed an increase in eye floaters, but an eye examination earlier today was reported as normal. The patient has been undergoing dry needling and stretching exercises as part of their physical therapy, which provides temporary relief.  The patient has tried naproxen for pain management with limited success and has seen neuro - prescribed a migraine medication, which they have not yet started. The patient denies any recent injury or trauma to the neck.              All review of systems negative except what is listed in the HPI      Objective:    BP 125/81   Pulse 94   Ht 5\' 6"  (1.676 m)   Wt 145 lb (65.8 kg)   LMP 03/10/2023   SpO2 98%   BMI 23.40 kg/m     Physical Exam Vitals reviewed.  Constitutional:      Appearance: Normal appearance.  Musculoskeletal:        General: No swelling.     Comments: Left upper trap with palpable tension and some tenderness; normal ROM and strength   Neurological:     Mental Status: She is alert and oriented to person, place, and time.  Psychiatric:        Mood and Affect: Mood normal.        Behavior: Behavior normal.        Thought Content: Thought content normal.        Judgment: Judgment normal.     No results found for any visits on 04/16/23.      Assessment & Plan:   Problem List Items Addressed This Visit   None Visit Diagnoses     Neck pain on left side    -  Primary   Relevant Medications   meloxicam (MOBIC) 15 MG tablet   predniSONE (DELTASONE) 20 MG tablet   cyclobenzaprine (FLEXERIL) 5 MG tablet   Other Relevant Orders   DG Cervical Spine Complete     Continue heat, massage, home exercises, stretches, physical therapy, good posture and body mechanics  Cervical spine xray today Prednisone burst for 5 days - if you cannot tolerate, you don't have to  finish it After finishing the prednisone, start meloxicam daily  Do NOT combine prednisone and meloxicam with each other or with any other NSAIDs (ibuprofen, Aleve, naproxen, etc) Use as needed Flexeril (muscle relaxer)  Follow-up if not improving after these measures. Consider ortho referral.   Meds ordered this encounter  Medications   meloxicam (MOBIC) 15 MG tablet    Sig: Take 1 tablet (15 mg total) by mouth daily.    Dispense:  30 tablet    Refill:  0    Order Specific Question:   Supervising Provider    Answer:   Danise Edge A [[id number]]   predniSONE (DELTASONE) 20 MG tablet    Sig: Take 1 tablet (20 mg total) by mouth daily with breakfast for 5 days.    Dispense:  5 tablet    Refill:  0    Order Specific Question:   Supervising Provider    Answer:   Danise Edge A [[id number]]   cyclobenzaprine (FLEXERIL) 5 MG tablet     Sig: Take 1 tablet (5 mg total) by mouth 3 (three) times daily as needed for muscle spasms.    Dispense:  30 tablet    Refill:  1    Order Specific Question:   Supervising Provider    Answer:   Danise Edge A [[id number]]    Return if symptoms worsen or fail to improve.  Clayborne Dana, NP

## 2023-04-16 NOTE — Patient Instructions (Addendum)
Continue heat, massage, home exercises, stretches, physical therapy, good posture and body mechanics  Cervical spine xray today Prednisone burst for 5 days - if you cannot tolerate, you don't have to finish it After finishing the prednisone, start meloxicam daily  Do NOT combine prednisone and meloxicam with each other or with any other NSAIDs (ibuprofen, Aleve, naproxen, etc) Use as needed Flexeril (muscle relaxer)  Follow-up if not improving after these measures. Consider ortho referral.

## 2023-04-17 ENCOUNTER — Telehealth: Payer: Self-pay | Admitting: Family Medicine

## 2023-04-17 DIAGNOSIS — M79602 Pain in left arm: Secondary | ICD-10-CM

## 2023-04-17 DIAGNOSIS — M542 Cervicalgia: Secondary | ICD-10-CM

## 2023-04-17 NOTE — Telephone Encounter (Signed)
Pt called and stated that she was advise from taylor to continue physical therapy. However, her physical therapist is needing another order. Please send to  [facility] at [facility].

## 2023-04-18 NOTE — Addendum Note (Signed)
Addended bySilvio Pate on: 04/18/2023 09:16 AM   Modules accepted: Orders

## 2023-04-18 NOTE — Telephone Encounter (Signed)
New referral placed.   Stacey Moss.

## 2023-04-21 ENCOUNTER — Encounter: Payer: Self-pay | Admitting: Family Medicine

## 2023-04-21 ENCOUNTER — Ambulatory Visit (INDEPENDENT_AMBULATORY_CARE_PROVIDER_SITE_OTHER): Payer: Medicaid Other | Admitting: Family Medicine

## 2023-04-21 VITALS — BP 112/72 | Ht 66.0 in | Wt 145.0 lb

## 2023-04-21 DIAGNOSIS — M79605 Pain in left leg: Secondary | ICD-10-CM

## 2023-04-21 NOTE — Patient Instructions (Signed)
You have strains of your peroneal muscles and lateral gastrocnemius (calf). Continue rehab as you have been for these. Do home exercises on days you don't go to therapy. Heat 15 minutes at a time as needed. Compression sleeve during the day will help as well. Follow up with [place] in 6 weeks or as needed if you're doing well.

## 2023-04-21 NOTE — Progress Notes (Signed)
PCP: Clayborne Dana, NP  Subjective:   HPI: Patient is a 22 y.o. female here for evaluation of left knee pain.  Previously seen on 7/22 and 03/06/2023 for this.  She plays golf, tennis.  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  She has been doing PT and home exercises for this with improvement in the knee pain, though today she has more lateral left leg pain.  The leg pain starts just below her knee and sometimes travels all the way down into her toes laterally.  Sometimes feels like the pain starts in her toes and travels up to below her knee.  The pain is mostly at night or upon awakening in the morning.  Denies any motor or sensory changes.  No swelling or injury.  PT has been helping.  Past Medical History:  Diagnosis Date   Allergic conjunctivitis 11/10/2018   Allergy    seasonal   Asthma    Bursitis of intermetatarsal bursa of left foot 09/18/2018   Eczema    History of food allergy 11/10/2018   Perennial allergic rhinitis 03/21/2015   Reactive airway disease 10/29/2018   Symptom of wheezing 10/29/2018   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   Urticaria    Well child visit 03/21/2015    Current Outpatient Medications on File Prior to Visit  Medication Sig Dispense Refill   cetirizine (ZYRTEC) 10 MG tablet Take 10 mg by mouth daily.     cyclobenzaprine (FLEXERIL) 5 MG tablet Take 1 tablet (5 mg total) by mouth 3 (three) times daily as needed for muscle spasms. 30 tablet 1   Lactic Ac-Citric Ac-Pot Bitart (PHEXXI) 1.8-1-0.4 % GEL Place 5 g vaginally as directed. Before intercourse 120 g 11   meloxicam (MOBIC) 15 MG tablet Take 1 tablet (15 mg total) by mouth daily. 30 tablet 0   predniSONE (DELTASONE) 20 MG tablet Take 1 tablet (20 mg total) by mouth daily with breakfast for 5 days. 5 tablet 0   Probiotic Product (PROBIOTIC PO) Take by mouth.     SUMAtriptan (IMITREX) 50 MG tablet Take 1 tablet (50 mg total) by mouth every 2 (two)  hours as needed for migraine. May repeat in 2 hours if headache persists or recurs. 10 tablet 6   No current facility-administered medications on file prior to visit.    Past Surgical History:  Procedure Laterality Date   WISDOM TOOTH EXTRACTION      Allergies  Allergen Reactions   Justicia Adhatoda (Malabar Nut Tree) [Justicia Adhatoda] Hives   Other     TREE NUTS   Augmentin [Amoxicillin-Pot Clavulanate] Rash    BP 112/72   Ht 5\' 6"  (1.676 m)   Wt 145 lb (65.8 kg)   LMP 04/09/2023 (Approximate)   BMI 23.40 kg/m       No data to display              No data to display              Objective:  Physical Exam:  Gen: NAD, comfortable in exam room  Left lower leg/knee/ankle No gross deformity, swelling, ecchymoses Full range of motion about the knee and ankle Mild tenderness to palpation over proximal peroneal and lateral gastrocnemius muscles Negative ant/post drawer of knee, negative valgus/varus testing, negative Thompson, positive talar tilt.   NV intact distally.    Assessment & Plan:  1.  Left peroneal and gastrocnemius muscle strain: As evidenced  by history and exam with reproducible pain on palpation and foot eversion.  Continue PT.  Recommended compression sleeve, heat pads, and home exercises.  Follow-up in 6 weeks or as needed.  Janeal Holmes, MD PGY-2, [facility]

## 2023-04-24 ENCOUNTER — Ambulatory Visit (INDEPENDENT_AMBULATORY_CARE_PROVIDER_SITE_OTHER): Payer: Medicaid Other | Admitting: Family Medicine

## 2023-04-24 ENCOUNTER — Encounter: Payer: Self-pay | Admitting: Family Medicine

## 2023-04-24 VITALS — BP 117/75 | HR 107 | Ht 66.0 in | Wt 144.0 lb

## 2023-04-24 DIAGNOSIS — M542 Cervicalgia: Secondary | ICD-10-CM

## 2023-04-24 NOTE — Progress Notes (Signed)
Acute Office Visit  Subjective:     Patient ID: Stacey Moss, female    DOB: Feb 12, 2001, 22 y.o.   MRN: [id number]  Chief Complaint  Patient presents with   Medical Management of Chronic Issues    HPI Patient is in today for neck/arm pain.   Discussed the use of AI scribe software for clinical note transcription with the patient, who gave verbal consent to proceed.  History of Present Illness   The patient, with a history of neck pain and inflammation, presents with persistent discomfort - it did improve with prednisone, but has started returning. They describe the pain as a heavy, throbbing sensation in the arm, extending from the shoulder to the hand. Occasionally, they experience a shooting pain from the neck to the shoulder. The patient also reports a sensation of a lump in the throat, which was relieved with prednisone.  The patient has been experiencing headaches, which they attribute to the neck pain and tension. They also report a sensation of tightness and soreness extending from the neck to the front of the chest, under the ribs.  The patient has been managing the neck pain with heat therapy, which they report as beneficial. They have also been given exercises from physical therapy, which they continue to perform at home.  The patient has a history of anxiety and is currently seeking counseling. They report a fear of medical interventions and a reluctance to take medications. Despite this, they are willing to try meloxicam for the inflammation and pain.  The patient's symptoms have caused significant distress, leading to episodes of crying due to the pain and stress. They express concern about the impact of their neck pain on their overall health and well-being.             ROS All review of systems negative except what is listed in the HPI      Objective:    BP 117/75   Pulse (!) 107   Ht 5\' 6"  (1.676 m)   Wt 144 lb (65.3 kg)   LMP 04/09/2023 (Approximate)    SpO2 98%   BMI 23.24 kg/m    Physical Exam Vitals reviewed.  Constitutional:      Appearance: Normal appearance.  Musculoskeletal:        General: No swelling.     Comments: Left upper trap with palpable tension and some tenderness; normal ROM and strength   Lymphadenopathy:     Cervical: No cervical adenopathy.  Neurological:     Mental Status: She is alert and oriented to person, place, and time.  Psychiatric:        Mood and Affect: Mood normal.        Behavior: Behavior normal.        Thought Content: Thought content normal.        Judgment: Judgment normal.     No results found for any visits on 04/24/23.      Assessment & Plan:   Problem List Items Addressed This Visit   None Visit Diagnoses     Neck pain on left side    -  Primary      Chronic neck pain with associated arm heaviness and occasional shooting pain. Improvement noted with prednisone, suggesting an inflammatory component. Possible nerve compression or muscle tension contributing to symptoms.  -Start Meloxicam daily for two weeks. -Continue home exercises for neck and shoulder as provided by physical therapy. -Apply heat to the area twice daily. -Consider referral to sports  medicine for further evaluation if no improvement with current plan.        No orders of the defined types were placed in this encounter.   Return if symptoms worsen or fail to improve.  Clayborne Dana, NP

## 2023-04-30 ENCOUNTER — Ambulatory Visit: Payer: Medicaid Other | Admitting: Diagnostic Neuroimaging

## 2023-05-04 NOTE — Progress Notes (Unsigned)
Psychiatric Initial Adult Assessment  Patient Identification: Stacey Moss MRN:  [id number] Date of Evaluation:  05/04/2023 Referral Source: Hyman Hopes, NP [facility]   Assessment:  Stacey Moss is a 22 y.o. female with a history of anxiety, migraines, asthma, seasonal allergies who presents in person to [facility] for initial evaluation of ***.  Patient reports ***  Plan:  # *** Past medication trials:  Status of problem: *** Interventions: -- ***  # *** Past medication trials:  Status of problem: *** Interventions: -- ***  # *** Past medication trials:  Status of problem: *** Interventions: -- ***  Patient was given contact information for behavioral health clinic and was instructed to call 911 for emergencies.   Subjective:  Chief Complaint: No chief complaint on file.  History of Present Illness:  *** -none -patient has transferred care among multiple PCP's due to dissatisfaction with care -sees PT for cervical pain, L knee pain (MRI normal) -saw neurology for headaches, did not want to take daily medications for her migraine, had normal neurology exam and imaging studies, recommended imitrex as needed  -Labs notable for normal AST/ALT, Cr. TSH wnl. Food panel negative for allergens  Past Psychiatric History:  Diagnoses: *** Medication trials: *** Previous psychiatrist/therapist: *** Hospitalizations: *** Suicide attempts: *** SIB: *** Hx of violence towards others: *** Current access to guns: *** Hx of trauma/abuse: ***  Substance Abuse History in the last 12 months:  {yes no:314532}  Past Medical History:  Past Medical History:  Diagnosis Date   Allergic conjunctivitis 11/10/2018   Allergy    seasonal   Asthma    Bursitis of intermetatarsal bursa of left foot 09/18/2018   Eczema    History of food allergy 11/10/2018   Perennial allergic rhinitis 03/21/2015   Reactive airway disease 10/29/2018   Symptom of  wheezing 10/29/2018   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   Urticaria    Well child visit 03/21/2015    Past Surgical History:  Procedure Laterality Date   WISDOM TOOTH EXTRACTION      Family Psychiatric History: ***  Family History:  Family History  Problem Relation Age of Onset   Arthritis Mother    Asthma Mother    Allergic rhinitis Brother    Breast cancer Maternal Aunt    Asthma Maternal Grandmother    Asthma Maternal Grandfather    Cancer Paternal Grandmother        breast   Breast cancer Paternal Grandmother 61    Social History:   Academic/Vocational: Production assistant, 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[employer])  Social History   Socioeconomic History   Marital status: Single    Spouse name: Not on file   Number of children: Not on file   Years of education: Not on file   Highest education level: Bachelor's degree (e.g., BA, AB, BS)  Occupational History   Not on file  Tobacco Use   Smoking status: Never    Passive exposure: Past   Smokeless tobacco: Never  Vaping Use   Vaping status: Never Used  Substance and Sexual Activity   Alcohol use: Not Currently    Comment: occ   Drug use: No   Sexual activity: Yes    Partners: Male    Birth control/protection: None, Condom    Comment: 1st intercourse- 16y.o, partners- 1  Other Topics Concern   Not on file  Social History Narrative   Not on file   Social Determinants  of Health   Financial Resource Strain: Low Risk  (11/21/2022)   Overall Financial Resource Strain (CARDIA)    Difficulty of Paying Living Expenses: Not very hard  Food Insecurity: No Food Insecurity (11/21/2022)   Hunger Vital Sign    Worried About Running Out of Food in the Last Year: Never true    Ran Out of Food in the Last Year: Never true  Transportation Needs: No Transportation Needs (11/21/2022)   PRAPARE - Administrator, Civil Service (Medical): No    Lack of Transportation (Non-Medical): No   Physical Activity: Insufficiently Active (11/21/2022)   Exercise Vital Sign    Days of Exercise per Week: 4 days    Minutes of Exercise per Session: 30 min  Stress: Not on file  Social Connections: Moderately Integrated (11/21/2022)   Social Connection and Isolation Panel [NHANES]    Frequency of Communication with Friends and Family: More than three times a week    Frequency of Social Gatherings with Friends and Family: More than three times a week    Attends Religious Services: Never    Database administrator or Organizations: Yes    Attends Banker Meetings: 1 to 4 times per year    Marital Status: Living with partner    Additional Social History: updated  Allergies:   Allergies  Allergen Reactions   Justicia Adhatoda (Malabar Nut Tree) [Justicia Adhatoda] Hives   Other     TREE NUTS   Augmentin [Amoxicillin-Pot Clavulanate] Rash    Current Medications: Current Outpatient Medications  Medication Sig Dispense Refill   cetirizine (ZYRTEC) 10 MG tablet Take 10 mg by mouth daily.     cyclobenzaprine (FLEXERIL) 5 MG tablet Take 1 tablet (5 mg total) by mouth 3 (three) times daily as needed for muscle spasms. 30 tablet 1   Lactic Ac-Citric Ac-Pot Bitart (PHEXXI) 1.8-1-0.4 % GEL Place 5 g vaginally as directed. Before intercourse 120 g 11   meloxicam (MOBIC) 15 MG tablet Take 1 tablet (15 mg total) by mouth daily. 30 tablet 0   Probiotic Product (PROBIOTIC PO) Take by mouth.     SUMAtriptan (IMITREX) 50 MG tablet Take 1 tablet (50 mg total) by mouth every 2 (two) hours as needed for migraine. May repeat in 2 hours if headache persists or recurs. 10 tablet 6   No current facility-administered medications for this visit.    ROS: Review of Systems  Objective:  Psychiatric Specialty Exam: Last menstrual period 04/09/2023.There is no height or weight on file to calculate BMI.  General Appearance: {Appearance:22683}  Eye Contact:  {BHH EYE CONTACT:22684}  Speech:   {Speech:22685}  Volume:  {Volume (PAA):22686}  Mood:  {BHH MOOD:22306}  Affect:  {Affect (PAA):22687}  Thought Content: {Thought Content:22690}   Suicidal Thoughts:  {ST/HT (PAA):22692}  Homicidal Thoughts:  {ST/HT (PAA):22692}  Thought Process:  {Thought Process (PAA):22688}  Orientation:  {BHH ORIENTATION (PAA):22689}    Memory:  Grossly intact ***  Judgment:  {Judgement (PAA):22694}  Insight:  {Insight (PAA):22695}  Concentration:  {Concentration:21399}  Recall:  not formally assessed ***  Fund of Knowledge: {BHH GOOD/FAIR/POOR:22877}  Language: {BHH GOOD/FAIR/POOR:22877}  Psychomotor Activity:  {Psychomotor (PAA):22696}  Akathisia:  {BHH YES OR NO:22294}  AIMS (if indicated): {Desc; done/not:10129}  Assets:  {Assets (PAA):22698}  ADL's:  {BHH HYQ'M:57846}  Cognition: {chl bhh cognition:304700322}  Sleep:  {BHH GOOD/FAIR/POOR:22877}   PE: General: well-appearing; no acute distress *** Pulm: no increased work of breathing on room air *** Strength & Muscle  Tone: {desc; muscle tone:32375} Neuro: no focal neurological deficits observed *** Gait & Station: {PE GAIT ED NATL:22525}  Metabolic Disorder Labs: No results found for: "HGBA1C", "MPG" No results found for: "PROLACTIN" Lab Results  Component Value Date   CHOL 201 (H) 10/07/2022   TRIG 123.0 10/07/2022   HDL 72.10 10/07/2022   CHOLHDL 3 10/07/2022   VLDL 24.6 10/07/2022   LDLCALC 104 (H) 10/07/2022   Lab Results  Component Value Date   TSH 2.34 10/07/2022    Therapeutic Level Labs: No results found for: "LITHIUM" No results found for: "CBMZ" No results found for: "VALPROATE"  Screenings:  AUDIT    Flowsheet Row Office Visit from 11/21/2022 in [facility] [place]  Alcohol Use Disorder Identification Test Final Score (AUDIT) 3      GAD-7    Flowsheet Row Office Visit from 01/29/2023 in [facility] Office Visit from 10/07/2022 in  [facility] [place]  Total GAD-7 Score 3 0      PHQ2-9    Flowsheet Row Office Visit from 02/06/2023 in [facility] Office Visit from 01/29/2023 in [facility] Office Visit from 11/21/2022 in [facility] [place] Office Visit from 10/07/2022 in [facility] [facility] Visit from 10/01/2022 in [facility] [place]  PHQ-2 Total Score 0 0 0 0 0  PHQ-9 Total Score 0 4 2 1  --      Flowsheet Row ED from 01/19/2023 in [facility] Emergency Department at [facility] Most recent reading at 01/19/2023 11:06 PM ED from 01/19/2023 in [facility] Department at [facility] Most recent reading at 01/19/2023  7:10 PM ED from 01/19/2023 in [facility] Most recent reading at 01/19/2023 12:15 PM  C-SSRS RISK CATEGORY No Risk No Risk No Risk       Collaboration of Care: Collaboration of Care: [facility] OP Collaboration of Care:[id number]}  Patient/Guardian was advised Release of Information must be obtained prior to any record release in order to collaborate their care with an outside provider. Patient/Guardian was advised if they have not already done so to contact the registration department to sign all necessary forms in order for [place] to release information regarding their care.   Consent: Patient/Guardian gives verbal consent for treatment and assignment of benefits for services provided during this visit. Patient/Guardian expressed understanding and agreed to proceed.   A total of *** minutes was spent involved in face to face clinical care, chart review, documentation, and ***.   Karie Fetch, MD 12/1/20246:42 PM

## 2023-05-07 ENCOUNTER — Ambulatory Visit (INDEPENDENT_AMBULATORY_CARE_PROVIDER_SITE_OTHER): Payer: Medicaid Other | Admitting: Family Medicine

## 2023-05-07 ENCOUNTER — Ambulatory Visit (HOSPITAL_COMMUNITY): Payer: Medicaid Other | Admitting: Psychiatry

## 2023-05-07 ENCOUNTER — Encounter: Payer: Self-pay | Admitting: Family Medicine

## 2023-05-07 VITALS — BP 120/82 | HR 100 | Ht 66.0 in | Wt 143.0 lb

## 2023-05-07 DIAGNOSIS — R109 Unspecified abdominal pain: Secondary | ICD-10-CM | POA: Diagnosis not present

## 2023-05-07 DIAGNOSIS — R071 Chest pain on breathing: Secondary | ICD-10-CM

## 2023-05-07 NOTE — Progress Notes (Signed)
Acute Office Visit  Subjective:     Patient ID: Stacey Moss, female    DOB: 2000/09/09, 22 y.o.   MRN: [id number]  Chief Complaint  Patient presents with   Abdominal Pain    HPI Patient is in today for left side pain.   Discussed the use of AI scribe software for clinical note transcription with the patient, who gave verbal consent to proceed.  History of Present Illness   The patient presented with a chief complaint of sharp, stabbing pain in the left upper abdomen and rib cage area. The pain was first noticed a few days after a long car ride to their father's house for Thanksgiving. The pain, initially intermittent and lasting for a few minutes, has become more consistent over time. It is exacerbated by eating, deep breathing, and certain movements, particularly twisting to the right. The patient also reported a sensation of food being stuck in the same area.  The patient has also been experiencing back pain, which they described as 'normal' and 'regular.' They noted that the abdominal pain does not feel the same as the back pain.  The patient reported a history of constipation, with bowel movements occurring less frequently than every other day. The most recent bowel movement was today (solid, normal) and previously a few days prior to the consultation. They also reported a sensation of cramping during bowel movements, which they had previously assumed was normal.  The patient also mentioned feeling bloated, but attributed this to their menstrual cycle. They denied any nausea, vomiting, diarrhea, rashes, heartburn, reflux, or other abdominal pain. They also denied any changes in passing gas.  The patient has been taking meloxicam and a probiotic. They reported using a heat pad for their back pain, which they felt might also be helping the abdominal pain.  The patient's pain has been worsening since its onset. It is now more consistent and noticeable, with severe 'hits' of pain that  last for a few seconds. The pain is not severe enough to prevent them from lying flat or to cause food regurgitation. She is concerned about her lungs given the pain with deep breaths.  The patient denied any recent trauma or major lifestyle changes. They also denied any symptoms of severe pain, nausea, vomiting, high fevers, blood in stool or vomit, shortness of breath, or chest pain.        ROS All review of systems negative except what is listed in the HPI      Objective:    BP 120/82   Pulse 100   Ht 5\' 6"  (1.676 m)   Wt 143 lb (64.9 kg)   LMP 04/09/2023 (Approximate)   SpO2 99%   BMI 23.08 kg/m    Physical Exam Vitals reviewed.  Constitutional:      Appearance: Normal appearance.  Cardiovascular:     Rate and Rhythm: Normal rate and regular rhythm.     Heart sounds: Normal heart sounds.  Pulmonary:     Effort: Pulmonary effort is normal.     Breath sounds: Normal breath sounds.  Abdominal:     General: Abdomen is flat. Bowel sounds are normal. There is no distension. There are no signs of injury.     Palpations: Abdomen is soft.     Tenderness: There is no right CVA tenderness, left CVA tenderness, guarding or rebound. Negative signs include Murphy's sign and McBurney's sign.     Comments: Mild discomfort to LUQ/lower rib palpation  Skin:    General:  Skin is warm and dry.     Findings: No rash.  Neurological:     Mental Status: She is alert and oriented to person, place, and time.  Psychiatric:        Mood and Affect: Mood normal.        Behavior: Behavior normal.        Thought Content: Thought content normal.        Judgment: Judgment normal.        No results found for any visits on 05/07/23.      Assessment & Plan:   Problem List Items Addressed This Visit   None Visit Diagnoses     Abdominal pain, unspecified abdominal location    -  Primary   Relevant Orders   CBC with Differential/Platelet   Comprehensive metabolic panel   Gamma GT    H. pylori breath test   Pain aggravated by breathing       Relevant Orders   D-Dimer, Quantitative      New onset, sharp, stabbing, and throbbing pain in the left upper quadrant and rib cage. Pain is exacerbated by eating, deep breathing, and twisting to the right. No associated nausea, vomiting, diarrhea, or rashes. Constipation reported. Physical exam revealed tenderness in the left upper quadrant. Possibly musculoskeletal.  -Order CBC, CMP, GGT, and H. Pylori test. -Consider trial of over-the-counter PPI (e.g., Prilosec, Nexium) to assess for possible acid-related etiology. -Continue meloxicam as prescribed. -Advise rest, heat application, and avoidance of activities that worsen pain. -Check D-dimer to rule out pulmonary embolism given pain with deep breathing. -Advise patient to seek immediate medical attention if experiencing severe pain, persistent nausea/vomiting, high fevers, hematochezia, hematemesis, shortness of breath, or chest pain. -Encourage increased fluid intake and dietary fiber.         No orders of the defined types were placed in this encounter.   Return if symptoms worsen or fail to improve.  Clayborne Dana, NP

## 2023-05-08 LAB — D-DIMER, QUANTITATIVE: D-Dimer, Quant: 0.37 ug{FEU}/mL (ref <0.50)

## 2023-05-08 LAB — CBC WITH DIFFERENTIAL/PLATELET
Basophils Absolute: 0 10*3/uL (ref 0.0–0.1)
Basophils Relative: 0.5 % (ref 0.0–3.0)
Eosinophils Absolute: 0.2 10*3/uL (ref 0.0–0.7)
Eosinophils Relative: 2.9 % (ref 0.0–5.0)
HCT: 41.7 % (ref 36.0–46.0)
Hemoglobin: 13.9 g/dL (ref 12.0–15.0)
Lymphocytes Relative: 23.3 % (ref 12.0–46.0)
Lymphs Abs: 1.6 10*3/uL (ref 0.7–4.0)
MCHC: 33.4 g/dL (ref 30.0–36.0)
MCV: 90 fL (ref 78.0–100.0)
Monocytes Absolute: 0.4 10*3/uL (ref 0.1–1.0)
Monocytes Relative: 5.6 % (ref 3.0–12.0)
Neutro Abs: 4.6 10*3/uL (ref 1.4–7.7)
Neutrophils Relative %: 67.7 % (ref 43.0–77.0)
Platelets: 215 10*3/uL (ref 150.0–400.0)
RBC: 4.63 Mil/uL (ref 3.87–5.11)
RDW: 12.5 % (ref 11.5–15.5)
WBC: 6.8 10*3/uL (ref 4.0–10.5)

## 2023-05-08 LAB — COMPREHENSIVE METABOLIC PANEL
ALT: 9 U/L (ref 0–35)
AST: 11 U/L (ref 0–37)
Albumin: 4.5 g/dL (ref 3.5–5.2)
Alkaline Phosphatase: 43 U/L (ref 39–117)
BUN: 8 mg/dL (ref 6–23)
CO2: 23 meq/L (ref 19–32)
Calcium: 9.3 mg/dL (ref 8.4–10.5)
Chloride: 106 meq/L (ref 96–112)
Creatinine, Ser: 0.61 mg/dL (ref 0.40–1.20)
GFR: 125.88 mL/min (ref >60.00)
Glucose, Bld: 95 mg/dL (ref 70–99)
Potassium: 3.4 meq/L — ABNORMAL LOW (ref 3.5–5.1)
Sodium: 138 meq/L (ref 135–145)
Total Bilirubin: 0.9 mg/dL (ref 0.2–1.2)
Total Protein: 6.9 g/dL (ref 6.0–8.3)

## 2023-05-08 LAB — GAMMA GT: GGT: 9 U/L (ref 7–51)

## 2023-05-12 LAB — H. PYLORI BREATH TEST: H. pylori Breath Test: NOT DETECTED

## 2023-05-13 ENCOUNTER — Ambulatory Visit (HOSPITAL_BASED_OUTPATIENT_CLINIC_OR_DEPARTMENT_OTHER)
Admission: RE | Admit: 2023-05-13 | Discharge: 2023-05-13 | Disposition: A | Discharge status: Home / Self Care | Payer: Medicaid Other | Source: Ambulatory Visit | Attending: Family Medicine | Admitting: Family Medicine

## 2023-05-13 ENCOUNTER — Encounter: Payer: Self-pay | Admitting: Family Medicine

## 2023-05-13 ENCOUNTER — Ambulatory Visit (INDEPENDENT_AMBULATORY_CARE_PROVIDER_SITE_OTHER): Payer: Medicaid Other | Admitting: Family Medicine

## 2023-05-13 VITALS — BP 124/86 | HR 100 | Ht 66.0 in | Wt 143.0 lb

## 2023-05-13 DIAGNOSIS — R1084 Generalized abdominal pain: Secondary | ICD-10-CM

## 2023-05-13 LAB — POC URINALSYSI DIPSTICK (AUTOMATED)
Bilirubin, UA: NEGATIVE
Blood, UA: NEGATIVE
Glucose, UA: NEGATIVE
Ketones, UA: NEGATIVE
Leukocytes, UA: NEGATIVE
Nitrite, UA: NEGATIVE
Protein, UA: NEGATIVE
Spec Grav, UA: 1.02 (ref 1.010–1.025)
Urobilinogen, UA: 0.2 U/dL
pH, UA: 6 (ref 5.0–8.0)

## 2023-05-13 NOTE — Progress Notes (Signed)
Acute Office Visit  Subjective:     Patient ID: Stacey Moss, female    DOB: Jun 12, 2000, 22 y.o.   MRN: [id number]  Chief Complaint  Patient presents with   Abdominal Pain     Patient is in today for abdominal pain.   Discussed the use of AI scribe software for clinical note transcription with the patient, who gave verbal consent to proceed.  History of Present Illness   The patient presents with persistent abdominal pain, primarily located in the upper mid abdomen and radiating down towards the left. The pain is described as a sharp stabbing sensation or an intense hunger-like pain. The discomfort is noted to be worse when lying down, particularly at night, and after meals. The patient also reports a sensation of tightness in the ribs, particularly noticeable when breathing out.  There is a history of constipation, with the most recent bowel movement causing discomfort. The patient also reported a temporary relief in abdominal pain following a bowel movement. There is no history of fever, vomiting, or blood in the stool, but the patient does report a sensation of nausea associated with the abdominal pain.  The patient also reports a sensation of discomfort in the lower back or hip region, likened to menstrual cramps, particularly noticeable prior to a bowel movement. There is no change in urinary frequency or urgency, and no burning sensation during urination.  The patient has a history of kidney stones but reports that the current pain does not resemble that experience. The patient has been using a heating pad for relief from rib discomfort, which seems to help. The patient also reports a sensation of fullness or pressure in the throat, similar to the feeling before vomiting, particularly noticeable when lying down.   She was seen for the same pain last week and had negative d-dimer and H. pylori. Unremarkable CBC, CMP.              All review of systems negative except what  is listed in the HPI      Objective:    BP 124/86   Pulse 100   Ht 5\' 6"  (1.676 m)   Wt 143 lb (64.9 kg)   LMP 04/09/2023 (Approximate)   SpO2 97%   BMI 23.08 kg/m    Physical Exam Vitals reviewed.  Constitutional:      Appearance: Normal appearance.  Cardiovascular:     Rate and Rhythm: Normal rate and regular rhythm.     Heart sounds: Normal heart sounds.  Pulmonary:     Effort: Pulmonary effort is normal.     Breath sounds: Normal breath sounds.  Abdominal:     General: Abdomen is flat. Bowel sounds are normal.     Palpations: Abdomen is soft.     Tenderness: There is abdominal tenderness in the epigastric area. There is no right CVA tenderness, left CVA tenderness, guarding or rebound. Negative signs include Murphy's sign and McBurney's sign.     Hernia: No hernia is present.  Musculoskeletal:     Comments: Mild tenderness to palpation of left lateral lower ribs intercostal space   Skin:    General: Skin is warm and dry.  Neurological:     Mental Status: She is alert and oriented to person, place, and time.  Psychiatric:        Mood and Affect: Mood normal.        Behavior: Behavior normal.        Thought Content: Thought content normal.  Judgment: Judgment normal.         Results for orders placed or performed in visit on 05/13/23  POCT Urinalysis Dipstick (Automated)  Result Value Ref Range   Color, UA yellow    Clarity, UA clear    Glucose, UA Negative Negative   Bilirubin, UA negative    Ketones, UA negative    Spec Grav, UA 1.020 1.010 - 1.025   Blood, UA negative    pH, UA 6.0 5.0 - 8.0   Protein, UA Negative Negative   Urobilinogen, UA 0.2 0.2 or 1.0 E.U./dL   Nitrite, UA negative    Leukocytes, UA Negative Negative        Assessment & Plan:   Problem List Items Addressed This Visit   None Visit Diagnoses     Generalized abdominal pain    -  Primary   Relevant Orders   DG Abd 1 View   POCT Urinalysis Dipstick (Automated)  (Completed)       Sharp, deep, gnawing pain in the upper mid and lower abdomen, worse at night and when lying down. Pain temporarily improves after bowel movement. Possible constipation. No fever, vomiting, or diarrhea. Possible reflux symptoms. -UA negative -Order abdominal X-ray to assess for stool burden. -Check urine sample to rule out urinary causes of abdominal pain. -Can trial PPI like Prilosec, Nexium, etc.  -If no improvement, consider GI workup -Consider anti-inflammatory and continued use of heating pad for possible rib inflammation as it sounds more like costochondritis.      Patient aware of signs/symptoms requiring further/urgent evaluation.    No orders of the defined types were placed in this encounter.   Return if symptoms worsen or fail to improve.  Clayborne Dana, NP

## 2023-05-19 ENCOUNTER — Ambulatory Visit: Payer: Medicaid Other

## 2023-05-19 NOTE — Therapy (Incomplete)
OUTPATIENT PHYSICAL THERAPY CERVICAL EVALUATION   Patient Name: Stacey Moss MRN: [id number] DOB:Oct 25, 2000, 22 y.o., female Today's Date: 05/19/2023  END OF SESSION:   Past Medical History:  Diagnosis Date   Allergic conjunctivitis 11/10/2018   Allergy    seasonal   Asthma    Bursitis of intermetatarsal bursa of left foot 09/18/2018   Eczema    History of food allergy 11/10/2018   Perennial allergic rhinitis 03/21/2015   Reactive airway disease 10/29/2018   Symptom of wheezing 10/29/2018   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   Urticaria    Well child visit 03/21/2015   Past Surgical History:  Procedure Laterality Date   WISDOM TOOTH EXTRACTION     Patient Active Problem List   Diagnosis Date Noted   Chronic migraine w/o aura w/o status migrainosus, not intractable 04/07/2023   Anxiety 04/07/2023   Chronic pain of left knee 11/29/2022   Seasonal allergic rhinitis due to pollen 10/07/2022   Moderate persistent asthma 11/10/2018   Atopic dermatitis 11/10/2018    PCP: Clayborne Dana, NP   REFERRING PROVIDER: Clayborne Dana, NP   REFERRING DIAG: M54.2 (ICD-10-CM) - Neck pain on left side M79.602 (ICD-10-CM) - Pain of left upper extremity  THERAPY DIAG:  No diagnosis found.  Rationale for Evaluation and Treatment: Rehabilitation  ONSET DATE: ***  SUBJECTIVE:                                                                                                                                                                                                         SUBJECTIVE STATEMENT: ***  Hand dominance: {MISC; OT HAND DOMINANCE:[phone]}  PERTINENT HISTORY:  ***  PAIN:  Are you having pain?  Yes: NPRS scale: ***/10 Pain location: *** Pain description: *** Aggravating factors: *** Relieving factors: ***  PRECAUTIONS: {Therapy precautions:24002}  RED FLAGS: {PT Red Flags:29287}     WEIGHT BEARING RESTRICTIONS: {Yes ***/No:24003}  FALLS:  Has patient fallen in  last 6 months? {fallsyesno:27318}  LIVING ENVIRONMENT: Lives with: {[facility] lives with:25569::"lives with their family"} Lives in: {Lives in:25570} Stairs: {opstairs:27293} Has following equipment at home: {Assistive devices:23999}  OCCUPATION: ***  PLOF: {PLOF:24004}  PATIENT GOALS: ***  NEXT MD VISIT: ***  OBJECTIVE:  Note: Objective measures were completed at Evaluation unless otherwise noted.  DIAGNOSTIC FINDINGS:  ***  PATIENT SURVEYS:  FOTO: ***% function; ***% predicted  COGNITION: Overall cognitive status: {cognition:24006}  SENSATION: {sensation:27233}  POSTURE: {posture:25561}  PALPATION: ***   CERVICAL ROM:   {AROM/PROM:27142} ROM A/PROM (deg) eval  Flexion   Extension   Right lateral  flexion   Left lateral flexion   Right rotation   Left rotation    (Blank rows = not tested)  UPPER EXTREMITY ROM:  {AROM/PROM:27142} ROM Right eval Left eval  Shoulder flexion    Shoulder extension    Shoulder abduction    Shoulder adduction    Shoulder extension    Shoulder internal rotation    Shoulder external rotation    Elbow flexion    Elbow extension    Wrist flexion    Wrist extension    Wrist ulnar deviation    Wrist radial deviation    Wrist pronation    Wrist supination     (Blank rows = not tested)  UPPER EXTREMITY MMT:  MMT Right eval Left eval  Shoulder flexion    Shoulder extension    Shoulder abduction    Shoulder adduction    Shoulder extension    Shoulder internal rotation    Shoulder external rotation    Middle trapezius    Lower trapezius    Elbow flexion    Elbow extension    Wrist flexion    Wrist extension    Wrist ulnar deviation    Wrist radial deviation    Wrist pronation    Wrist supination    Grip strength     (Blank rows = not tested)  CERVICAL SPECIAL TESTS:  {Cervical special tests:25246}  FUNCTIONAL TESTS:  {Functional tests:24029}  TREATMENT: [facility] Adult PT Treatment:                                                 DATE: *** Therapeutic Exercise: *** Manual Therapy: *** Neuromuscular re-ed: *** Therapeutic Activity: *** Modalities: *** Self Care: ***  PATIENT EDUCATION:  Education details: *** Person educated: {Person educated:25204} Education method: {Education Method:25205} Education comprehension: {Education Comprehension:25206}  HOME EXERCISE PROGRAM: ***  ASSESSMENT:  CLINICAL IMPRESSION: Patient is a *** y.o. *** who was seen today for physical therapy evaluation and treatment for ***.   OBJECTIVE IMPAIRMENTS: {opptimpairments:25111}.   ACTIVITY LIMITATIONS: {activitylimitations:27494}  PARTICIPATION LIMITATIONS: {participationrestrictions:25113}  PERSONAL FACTORS: {Personal factors:25162} are also affecting patient's functional outcome.   REHAB POTENTIAL: {rehabpotential:25112}  CLINICAL DECISION MAKING: {clinical decision making:25114}  EVALUATION COMPLEXITY: {Evaluation complexity:25115}   GOALS: Goals reviewed with patient? {yes/no:20286}  SHORT TERM GOALS: Target date: ***  *** Baseline:  Goal status: INITIAL  2.  *** Baseline:  Goal status: INITIAL  LONG TERM GOALS: Target date: ***  *** Baseline:  Goal status: INITIAL  2.  *** Baseline:  Goal status: INITIAL  3.  *** Baseline:  Goal status: INITIAL  4.  *** Baseline:  Goal status: INITIAL  5.  *** Baseline:  Goal status: INITIAL  6.  *** Baseline:  Goal status: INITIAL   PLAN:  PT FREQUENCY: {rehab frequency:25116}  PT DURATION: {rehab duration:25117}  PLANNED INTERVENTIONS: {rehab planned interventions:25118::"97110-Therapeutic exercises","97530- Therapeutic [phone]- Neuromuscular re-education","97535- Self JXBJ","47829- Manual therapy"}  PLAN FOR NEXT SESSION: Eloy End, PT 05/19/2023, 7:52 AM

## 2023-05-20 ENCOUNTER — Telehealth: Payer: Self-pay | Admitting: Family Medicine

## 2023-05-20 ENCOUNTER — Ambulatory Visit (HOSPITAL_COMMUNITY): Payer: Self-pay | Admitting: Clinical

## 2023-05-20 NOTE — Telephone Encounter (Signed)
FYI: This call has been transferred to triage nurse: the Triage Nurse. Once the result note has been entered staff can address the message at that time.  Patient called in with the following symptoms:  Red Word:abdominal pain and Occasional Pain level of 8/10   Please advise at Mobile [phone] (mobile)  Message is routed to Provider Pool.

## 2023-05-21 ENCOUNTER — Ambulatory Visit (INDEPENDENT_AMBULATORY_CARE_PROVIDER_SITE_OTHER): Payer: Medicaid Other | Admitting: Family Medicine

## 2023-05-21 ENCOUNTER — Encounter: Payer: Self-pay | Admitting: Family Medicine

## 2023-05-21 ENCOUNTER — Other Ambulatory Visit (HOSPITAL_BASED_OUTPATIENT_CLINIC_OR_DEPARTMENT_OTHER): Payer: Self-pay

## 2023-05-21 VITALS — BP 115/77 | HR 103 | Temp 98.3°F | Resp 20 | Ht 66.0 in | Wt 141.8 lb

## 2023-05-21 DIAGNOSIS — R1032 Left lower quadrant pain: Secondary | ICD-10-CM | POA: Diagnosis not present

## 2023-05-21 DIAGNOSIS — R110 Nausea: Secondary | ICD-10-CM | POA: Diagnosis not present

## 2023-05-21 DIAGNOSIS — R109 Unspecified abdominal pain: Secondary | ICD-10-CM | POA: Diagnosis not present

## 2023-05-21 MED ORDER — ONDANSETRON 4 MG PO TBDP: 4.0000 mg | ORAL_TABLET | Freq: Four times a day (QID) | ORAL | 0 refills | Status: AC | PRN

## 2023-05-21 NOTE — Telephone Encounter (Signed)
On schedule today

## 2023-05-21 NOTE — Progress Notes (Signed)
Acute Office Visit  Subjective:     Patient ID: Stacey Moss, female    DOB: Feb 01, 2001, 22 y.o.   MRN: [id number]  Chief Complaint  Patient presents with   Pain    Abdominal    HPI Patient is in today for persistent abdominal pain.   Discussed the use of AI scribe software for clinical note transcription with the patient, who gave verbal consent to proceed.  History of Present Illness   The patient has been experiencing persistent abdominal discomfort for approximately three to four weeks. The discomfort, initially localized to the left side, has since migrated towards the center of the abdomen as well. The pain is described as a constant discomfort, rating around 4-5 out of 10, with intermittent episodes of sharp, stabbing pain that reach an intensity of 8 out of 10, occurring three to four times daily.  The patient reports feeling nauseous, particularly upon waking and after meals, likening the sensation to a stomach hangover. They deny any changes in diet and report irregular bowel movements at baseline. They experienced a particularly painful bowel movement two nights prior to the consultation, which provided temporary relief.   The patient has been trialing a bland diet and suspects a possible intolerance to gluten, as consumption seems to cause bloating. They deny any significant changes in weight or appetite. The patient denies pregnancy, having taken a home pregnancy test to rule it out.  The patient's discomfort is exacerbated by palpation, particularly in the left lower middle area beneath the belly button. The patient also reports feeling queasy during deep inhalation. No other associated symptoms or relevant medical history were reported.   Labs and KUB unremarkable at visit last week.             ROS All review of systems negative except what is listed in the HPI      Objective:    BP 115/77 (BP Location: Right Arm, Patient Position: Sitting, Cuff Size:  Normal)   Pulse (!) 103   Temp 98.3 F (36.8 C) (Oral)   Resp 20   Ht 5\' 6"  (1.676 m)   Wt 141 lb 12.8 oz (64.3 kg)   LMP 05/10/2023 (Exact Date)   SpO2 97%   BMI 22.89 kg/m    Physical Exam Vitals reviewed.  Constitutional:      Appearance: Normal appearance.  Pulmonary:     Effort: Pulmonary effort is normal.  Abdominal:     General: Abdomen is flat. Bowel sounds are normal. There is no distension.     Palpations: Abdomen is soft. There is no mass.     Tenderness: There is abdominal tenderness in the periumbilical area, left upper quadrant and left lower quadrant. There is no guarding or rebound. Negative signs include Murphy's sign and McBurney's sign.  Neurological:     Mental Status: She is alert.     No results found for any visits on 05/21/23.      Assessment & Plan:   Problem List Items Addressed This Visit   None Visit Diagnoses       Left sided abdominal pain    -  Primary   Relevant Orders   CT ABDOMEN PELVIS W CONTRAST   Ambulatory referral to Gastroenterology     Left lower quadrant abdominal pain       Relevant Orders   CT ABDOMEN PELVIS W CONTRAST   Ambulatory referral to Gastroenterology     Nausea       Relevant Medications  ondansetron (ZOFRAN-ODT) 4 MG disintegrating tablet   Other Relevant Orders   CT ABDOMEN PELVIS W CONTRAST   Ambulatory referral to Gastroenterology         Abdominal Pain Persistent abdominal pain for 3-4 weeks, initially left-sided, now more central as well. Pain is described as a constant discomfort (4-5/10) with intermittent sharp stabbing pain (8/10) occurring 3-4 times a day. Pain is worse in the morning and at night, and improves with movement. Associated with nausea, particularly in the morning. No significant dietary changes. Bowel movements irregular, recent episode of constipation with painful defecation. No significant rectal bleeding. Previous imaging (abdominal x-ray) showed some stool but no obstruction.  Celiac testing negative a few months ago. Labs stable at previous visit. Pregnancy test negative today. -Order CT scan of abdomen and pelvis to further evaluate the cause of the pain. -Refer to Gastroenterology for further evaluation and management.        Meds ordered this encounter  Medications   ondansetron (ZOFRAN-ODT) 4 MG disintegrating tablet    Sig: Take 1 tablet (4 mg total) by mouth every 6 (six) hours as needed for nausea.    Dispense:  20 tablet    Refill:  0    Supervising Provider:   Danise Edge A [[id number]]    Return if symptoms worsen or fail to improve.  Clayborne Dana, NP

## 2023-05-21 NOTE — Telephone Encounter (Signed)
Initial Comment Caller states she is having abdominal pain. Not severe. Additional Comment transferred from office. Translation No Nurse Assessment Nurse: Jayme Cloud, RN, Luis Date/Time (Eastern Time): 05/20/2023 5:15:19 PM Confirm and document reason for call. If symptomatic, describe symptoms. ---Caller states abdominal pain prior to thanksgiving, below LEFT sided rib cage. Reports evaluated by provider x2. Reports worsening pain with hunger, when eating or when having to have a BM. Does the patient have any new or worsening symptoms? ---Yes Will a triage be completed? ---Yes Related visit to physician within the last 2 weeks? ---Yes Does the PT have any chronic conditions? (i.e. diabetes, asthma, this includes High risk factors for pregnancy, etc.) ---No Is the patient pregnant or possibly pregnant? (Ask all females between the ages of 33-55) ---No Is this a behavioral health or substance abuse call? ---No Guidelines Guideline Title Affirmed Question Affirmed Notes Nurse Date/Time (Eastern Time) Abdominal Pain - Female [1] MODERATE pain (e.g., interferes with normal activities) AND [2] pain comes and goes (cramps) AND [3] present > 24 hours (Exception: Pain with Jayme Cloud RN, Luis 05/20/2023 5:17:34 PM PLEASE NOTE: All timestamps contained within this report are represented as [place] Standard Time. CONFIDENTIALTY NOTICE: This fax transmission is intended only for the addressee. It contains information that is legally privileged, confidential or otherwise protected from use or disclosure. If you are not the intended recipient, you are strictly prohibited from reviewing, disclosing, copying using or disseminating any of this information or taking any action in reliance on or regarding this information. If you have received this fax in error, please notify us immediately by telephone so that we can arrange for its return to [place]. Phone: [phone], Toll-Free: [phone],  Fax: [phone] Page: 2 of 2 Call Id: [id number] Guidelines Guideline Title Affirmed Question Affirmed Notes Nurse Date/Time Lamount Cohen Time) Vomiting or Diarrhea - see that Guideline.) Disp. Time Lamount Cohen Time) Disposition Final User 05/20/2023 4:53:01 PM Attempt made - message left Clyda Hurdle 05/20/2023 5:23:26 PM See PCP within 24 Hours Yes Jayme Cloud RN, [place] Final Disposition 05/20/2023 5:23:26 PM See PCP within 24 Hours Yes Jayme Cloud, RN, Trinna Balloon Disagree/Comply Comply Caller Understands Yes PreDisposition Did not know what to do Care Advice Given Per Guideline SEE PCP WITHIN 24 HOURS: * IF OFFICE WILL BE OPEN: You need to be examined within the next 24 hours. Call your doctor (or NP/PA) when the office opens and make an appointment. CALL BACK IF: * You become worse * Drink adequate fluids. Eat a bland diet. Referrals REFERRED TO PCP OFFICE

## 2023-05-23 ENCOUNTER — Telehealth (HOSPITAL_BASED_OUTPATIENT_CLINIC_OR_DEPARTMENT_OTHER): Payer: Self-pay | Admitting: Family Medicine

## 2023-05-23 ENCOUNTER — Encounter: Payer: Self-pay | Admitting: Family Medicine

## 2023-05-29 ENCOUNTER — Ambulatory Visit (HOSPITAL_BASED_OUTPATIENT_CLINIC_OR_DEPARTMENT_OTHER)
Admission: RE | Admit: 2023-05-29 | Discharge: 2023-05-29 | Disposition: A | Discharge status: Home / Self Care | Payer: Medicaid Other | Source: Ambulatory Visit | Attending: Family Medicine | Admitting: Family Medicine

## 2023-05-29 DIAGNOSIS — R1032 Left lower quadrant pain: Secondary | ICD-10-CM | POA: Insufficient documentation

## 2023-05-29 DIAGNOSIS — R109 Unspecified abdominal pain: Secondary | ICD-10-CM | POA: Diagnosis present

## 2023-05-29 DIAGNOSIS — R110 Nausea: Secondary | ICD-10-CM | POA: Diagnosis present

## 2023-05-29 MED ORDER — IOHEXOL 300 MG/ML  SOLN
100.0000 mL | Freq: Once | INTRAMUSCULAR | Status: AC | PRN
  Administered 2023-05-29: 100 mL via INTRAVENOUS

## 2023-06-05 ENCOUNTER — Other Ambulatory Visit: Payer: Self-pay

## 2023-06-05 ENCOUNTER — Ambulatory Visit: Payer: Medicaid Other | Attending: Sports Medicine

## 2023-06-05 DIAGNOSIS — M542 Cervicalgia: Secondary | ICD-10-CM | POA: Insufficient documentation

## 2023-06-05 DIAGNOSIS — R293 Abnormal posture: Secondary | ICD-10-CM | POA: Diagnosis present

## 2023-06-05 DIAGNOSIS — M6281 Muscle weakness (generalized): Secondary | ICD-10-CM | POA: Diagnosis present

## 2023-06-05 DIAGNOSIS — M79602 Pain in left arm: Secondary | ICD-10-CM | POA: Insufficient documentation

## 2023-06-05 NOTE — Therapy (Signed)
 OUTPATIENT PHYSICAL THERAPY CERVICAL EVALUATION   Patient Name: Stacey Moss MRN: [id number] DOB:[birth year], 23 y.o., female Today's Date: 06/05/2023  END OF SESSION:  PT End of Session - 06/05/23 1423     Visit Number 1    Number of Visits 7    Date for PT Re-Evaluation 07/31/23    Authorization Type [employer]    PT Start Time 0935    PT Stop Time 1005    PT Time Calculation (min) 30 min    Activity Tolerance Patient tolerated treatment well    Behavior During Therapy [facility] for tasks assessed/performed             Past Medical History:  Diagnosis Date   Allergic conjunctivitis 11/10/2018   Allergy     seasonal   Asthma    Bursitis of intermetatarsal bursa of left foot 09/18/2018   Eczema    History of food allergy  11/10/2018   Perennial allergic rhinitis 03/21/2015   Reactive airway disease 10/29/2018   Symptom of wheezing 10/29/2018   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   Urticaria    Well child visit 03/21/2015   Past Surgical History:  Procedure Laterality Date   WISDOM TOOTH EXTRACTION     Patient Active Problem List   Diagnosis Date Noted   Chronic migraine w/o aura w/o status migrainosus, not intractable 04/07/2023   Anxiety 04/07/2023   Chronic pain of left knee 11/29/2022   Seasonal allergic rhinitis due to pollen 10/07/2022   Moderate persistent asthma 11/10/2018   Atopic dermatitis 11/10/2018    PCP: Almarie Waddell NOVAK, NP   REFERRING PROVIDER: Almarie Waddell NOVAK, NP   REFERRING DIAG: M54.2 (ICD-10-CM) - Neck pain on left side M79.602 (ICD-10-CM) - Pain of left upper extremity  THERAPY DIAG:  Cervicalgia - Plan: PT plan of care cert/re-cert  Muscle weakness (generalized) - Plan: PT plan of care cert/re-cert  Abnormal posture - Plan: PT plan of care cert/re-cert  Rationale for Evaluation and Treatment: Rehabilitation  ONSET DATE: Chronic  SUBJECTIVE:                                                                                                                                                                                                          SUBJECTIVE STATEMENT: Pt presents to PT with continued reports of neck and L shoulder/periscapular pain. She is well known to therapist and this has been an ongoing issue, usually also associated with migraines although this has been better recently.   Hand dominance: Right  PERTINENT HISTORY:  See PMH  PAIN:  Are you  having pain?  Yes: NPRS scale: 0/10 Worst: 6/10 Pain location: L side of neck, L periscapular Pain description: spasm, tight Aggravating factors: studying/school work, sleeping Relieving factors: heat, stretching  PRECAUTIONS: None  RED FLAGS: None     WEIGHT BEARING RESTRICTIONS: No  FALLS:  Has patient fallen in last 6 months? No  LIVING ENVIRONMENT: Lives with: lives with their family Lives in: House/apartment  OCCUPATION: It trainer at [employer]  PLOF: Independent  PATIENT GOALS: decrease neck and L shoulder pain;   NEXT MD VISIT: None scheduled  OBJECTIVE:  Note: Objective measures were completed at Evaluation unless otherwise noted.  DIAGNOSTIC FINDINGS:  See imaging  PATIENT SURVEYS:  FOTO: 61% function; 66% predicted  COGNITION: Overall cognitive status: Within functional limits for tasks assessed  SENSATION: WFL  POSTURE: rounded shoulders, forward head, and increased thoracic kyphosis  PALPATION: TTP to L upper trap, L cervical paraspinals   CERVICAL ROM:   Active ROM A/PROM (deg) eval  Flexion   Extension   Right lateral flexion   Left lateral flexion   Right rotation 70  Left rotation 65   (Blank rows = not tested)  UPPER EXTREMITY ROM:  Active ROM Right eval Left eval  Shoulder flexion [facility]  Shoulder extension    Shoulder abduction    Shoulder adduction    Shoulder extension    Shoulder internal rotation    Shoulder external rotation    Elbow flexion    Elbow extension    Wrist flexion    Wrist extension    Wrist  ulnar deviation    Wrist radial deviation    Wrist pronation    Wrist supination     (Blank rows = not tested)  UPPER EXTREMITY MMT:  MMT Right eval Left eval  Shoulder flexion    Shoulder extension    Shoulder abduction    Shoulder adduction    Shoulder extension    Shoulder internal rotation    Shoulder external rotation    Middle trapezius 3+/5 3+/5  Lower trapezius 3+/5 3+/5  Elbow flexion    Elbow extension    Wrist flexion    Wrist extension    Wrist ulnar deviation    Wrist radial deviation    Wrist pronation    Wrist supination    Grip strength     (Blank rows = not tested)  CERVICAL SPECIAL TESTS:  Upper limb tension test (ULTT): Negative  FUNCTIONAL TESTS:  DNT  TREATMENT: [facility] Adult PT Treatment:                                                DATE: 06/05/2023 Manual Therapy: Suboccpittial release Positional release to L upper trap Manual Therapy: MHP to cervical paraspinals post session Trigger Point Dry Needling:  Initial Treatment: Pt instructed on Dry Needling rational, procedures, and possible side effects. Pt instructed to expect mild to moderate muscle soreness later in the day and/or into the next day.   Patient Verbal Consent Given: Yes Education Handout Provided: Yes Muscles Treated: left upper trapezius Electrical Stimulation Performed: No Treatment Response/Outcome: twitch response elicited with decrease in muscle tension noted    PATIENT EDUCATION:  Education details: eval findings, FOTO, HEP, POC Person educated: Patient Education method: Explanation, Demonstration, and Handouts Education comprehension: verbalized understanding and returned demonstration  HOME EXERCISE PROGRAM: Access Code: [email or web address] URL: [email or web address]  Date: 06/05/2023 Prepared by: Alm Kingdom  Exercises - Supine Chin Tuck  - 1 x daily - 7 x weekly - 2 sets - 10 reps - 5 sec hold - Standing Shoulder Row with Anchored Resistance  - 1 x  daily - 7 x weekly - 3 sets - 10 reps - black band hold - Seated Upper Trapezius Stretch (Mirrored)  - 1 x daily - 7 x weekly - 2 reps - 30 sec hold - Cat Cow  - 1 x daily - 7 x weekly - 2 sets - 10 reps - Prone Scapular Retraction Y  - 1 x daily - 7 x weekly - 2 sets - 10 reps  ASSESSMENT:  CLINICAL IMPRESSION: Patient is a 23 y.o. F who was seen today for physical therapy evaluation and treatment for chronic L sided neck pain. Physical findings are consistent with referring provider impression as pt demonstrates decrease in cervical ROM and TTP to L cervical musculature. Pt's FOTO score demonstrates decrease in subjective functional ability below PLOF. Pt would benefit from skilled PT services working on improving periscapular strength and decreasing muscle pain.   OBJECTIVE IMPAIRMENTS: decreased activity tolerance, decreased endurance, decreased mobility, decreased ROM, decreased strength, postural dysfunction, and pain   ACTIVITY LIMITATIONS: carrying, lifting, and reach over head  PARTICIPATION LIMITATIONS: driving, shopping, community activity, occupation, and yard work  PERSONAL FACTORS: Time since onset of injury/illness/exacerbation are also affecting patient's functional outcome.   REHAB POTENTIAL: Excellent  CLINICAL DECISION MAKING: Stable/uncomplicated  EVALUATION COMPLEXITY: Low   GOALS: Goals reviewed with patient? No  SHORT TERM GOALS: Target date: 06/26/2023   Pt will be compliant and knowledgeable with initial HEP for improved comfort and carryover Baseline: initial HEP given  Goal status: INITIAL  2.  Pt will self report neck pain no greater than 4/10 for improved comfort and functional ability Baseline: 6/10 at worst Goal status: INITIAL   LONG TERM GOALS: Target date: 07/17/2023   Pt will improve FOTO function score to no less than 66% as proxy for functional improvement Baseline: 61% function Goal status: INITIAL   2.  Pt will self report neck pain no  greater than 1-2/10 for improved comfort and functional ability Baseline: 6/10 at worst Goal status: INITIAL   3.  Pt will improve bilateral cervical rotation ROM too less than 80 degrees for improved functional ability with driving and school activities Baseline:  Goal status: INITIAL  4.  Pt will improve all UE MMT to no less than 4/5 for improved postural endurance and decrease neck pain Baseline:  Goal status: INITIAL   PLAN:  PT FREQUENCY: 1x/week  PT DURATION: 6 weeks  PLANNED INTERVENTIONS: 97164- PT Re-evaluation, 97110-Therapeutic exercises, 97530- Therapeutic activity, 97112- Neuromuscular re-education, 97535- Self Care, [id number]- Manual therapy, 97014- Electrical stimulation (unattended), [id number]- Electrical stimulation (manual), Dry Needling, Cryotherapy, and Moist heat  PLAN FOR NEXT SESSION: assess HEP response, periscapular strengthening, DNF exercises, TPDN   Alm JAYSON Kingdom, PT 06/05/2023, 2:37 PM

## 2023-06-10 ENCOUNTER — Ambulatory Visit: Payer: Medicaid Other | Admitting: Radiology

## 2023-06-16 ENCOUNTER — Ambulatory Visit: Payer: Medicaid Other

## 2023-06-17 ENCOUNTER — Ambulatory Visit: Payer: Medicaid Other | Admitting: Neurology

## 2023-06-18 ENCOUNTER — Encounter: Payer: Self-pay | Admitting: Gastroenterology

## 2023-06-23 ENCOUNTER — Ambulatory Visit: Payer: Medicaid Other

## 2023-06-23 ENCOUNTER — Telehealth: Payer: Self-pay

## 2023-06-23 NOTE — Telephone Encounter (Signed)
PT called and left a voicemail regarding missed visit. Left option to replace appointment and reminder of attendance policy.   Eloy End   06/23/23 10:34 AM

## 2023-06-30 ENCOUNTER — Ambulatory Visit: Payer: Medicaid Other

## 2023-06-30 DIAGNOSIS — M6281 Muscle weakness (generalized): Secondary | ICD-10-CM

## 2023-06-30 DIAGNOSIS — R293 Abnormal posture: Secondary | ICD-10-CM

## 2023-06-30 DIAGNOSIS — M542 Cervicalgia: Secondary | ICD-10-CM | POA: Diagnosis not present

## 2023-06-30 NOTE — Therapy (Signed)
OUTPATIENT PHYSICAL THERAPY TREATMENT   Patient Name: Stacey Moss MRN: [id number] DOB:[birth year], 23 y.o., female Today's Date: 06/30/2023  END OF SESSION:  PT End of Session - 06/30/23 1020     Visit Number 2    Number of Visits 7    Date for PT Re-Evaluation 07/31/23    Authorization Type [employer]    PT Start Time 1020    PT Stop Time 1058    PT Time Calculation (min) 38 min    Activity Tolerance Patient tolerated treatment well    Behavior During Therapy [facility] for tasks assessed/performed              Past Medical History:  Diagnosis Date   Allergic conjunctivitis 11/10/2018   Allergy    seasonal   Asthma    Bursitis of intermetatarsal bursa of left foot 09/18/2018   Eczema    History of food allergy 11/10/2018   Perennial allergic rhinitis 03/21/2015   Reactive airway disease 10/29/2018   Symptom of wheezing 10/29/2018   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   Urticaria    Well child visit 03/21/2015   Past Surgical History:  Procedure Laterality Date   WISDOM TOOTH EXTRACTION     Patient Active Problem List   Diagnosis Date Noted   Chronic migraine w/o aura w/o status migrainosus, not intractable 04/07/2023   Anxiety 04/07/2023   Chronic pain of left knee 11/29/2022   Seasonal allergic rhinitis due to pollen 10/07/2022   Moderate persistent asthma 11/10/2018   Atopic dermatitis 11/10/2018    PCP: Clayborne Dana, NP   REFERRING PROVIDER: Clayborne Dana, NP   REFERRING DIAG: M54.2 (ICD-10-CM) - Neck pain on left side M79.602 (ICD-10-CM) - Pain of left upper extremity  THERAPY DIAG:  Cervicalgia  Muscle weakness (generalized)  Abnormal posture  Rationale for Evaluation and Treatment: Rehabilitation  ONSET DATE: Chronic  SUBJECTIVE:                                                                                                                                                                                                         SUBJECTIVE STATEMENT: Pt  presents to PT with reports of no current pain, did have HA symptoms earlier this week. Continued tightness in L upper trap.   Hand dominance: Right  PERTINENT HISTORY:  See PMH  PAIN:  Are you having pain?  Yes: NPRS scale: 0/10 Worst: 6/10 Pain location: L side of neck, L periscapular Pain description: spasm, tight Aggravating factors: studying/school work, sleeping Relieving factors: heat, stretching  PRECAUTIONS: None  RED  FLAGS: None     WEIGHT BEARING RESTRICTIONS: No  FALLS:  Has patient fallen in last 6 months? No  LIVING ENVIRONMENT: Lives with: lives with their family Lives in: House/apartment  OCCUPATION: IT trainer at [employer]  PLOF: Independent  PATIENT GOALS: decrease neck and L shoulder pain;   NEXT MD VISIT: None scheduled  OBJECTIVE:  Note: Objective measures were completed at Evaluation unless otherwise noted.  DIAGNOSTIC FINDINGS:  See imaging  PATIENT SURVEYS:  FOTO: 61% function; 66% predicted  COGNITION: Overall cognitive status: Within functional limits for tasks assessed  SENSATION: WFL  POSTURE: rounded shoulders, forward head, and increased thoracic kyphosis  PALPATION: TTP to L upper trap, L cervical paraspinals   CERVICAL ROM:   Active ROM A/PROM (deg) eval  Flexion   Extension   Right lateral flexion   Left lateral flexion   Right rotation 70  Left rotation 65   (Blank rows = not tested)  UPPER EXTREMITY ROM:  Active ROM Right eval Left eval  Shoulder flexion [facility]  Shoulder extension    Shoulder abduction    Shoulder adduction    Shoulder extension    Shoulder internal rotation    Shoulder external rotation    Elbow flexion    Elbow extension    Wrist flexion    Wrist extension    Wrist ulnar deviation    Wrist radial deviation    Wrist pronation    Wrist supination     (Blank rows = not tested)  UPPER EXTREMITY MMT:  MMT Right eval Left eval  Shoulder flexion    Shoulder extension     Shoulder abduction    Shoulder adduction    Shoulder extension    Shoulder internal rotation    Shoulder external rotation    Middle trapezius 3+/5 3+/5  Lower trapezius 3+/5 3+/5  Elbow flexion    Elbow extension    Wrist flexion    Wrist extension    Wrist ulnar deviation    Wrist radial deviation    Wrist pronation    Wrist supination    Grip strength     (Blank rows = not tested)  CERVICAL SPECIAL TESTS:  Upper limb tension test (ULTT): Negative  FUNCTIONAL TESTS:  DNT  TREATMENT: [facility] Adult PT Treatment:                                                DATE: 06/05/2023 Therapeutic Exercise: UBE lvl 1.5 x 4 min while taking subjective Seated low row 2x10 25# Seated lat pulldown 3x8 35# Seated bilateral ER 2x15 GTB Seated horizontal abd 2x15 GTB S/L open book x 10 each POE chin tuck 2x10 Manual Therapy: Suboccpittial release Positional release to L upper trap Skilled palpation of trigger points for TPDN Trigger Point Dry Needling:  Initial Treatment: Pt instructed on Dry Needling rational, procedures, and possible side effects. Pt instructed to expect mild to moderate muscle soreness later in the day and/or into the next day.   Patient Verbal Consent Given: Yes Education Handout Provided: Yes Muscles Treated: left upper trapezius Electrical Stimulation Performed: No Treatment Response/Outcome: twitch response elicited with decrease in muscle tension noted  [facility] Adult PT Treatment:  DATE: 06/05/2023 Manual Therapy: Suboccpittial release Positional release to L upper trap Manual Therapy: MHP to cervical paraspinals post session Trigger Point Dry Needling:  Initial Treatment: Pt instructed on Dry Needling rational, procedures, and possible side effects. Pt instructed to expect mild to moderate muscle soreness later in the day and/or into the next day.   Patient Verbal Consent Given: Yes Education Handout Provided:  Yes Muscles Treated: left upper trapezius Electrical Stimulation Performed: No Treatment Response/Outcome: twitch response elicited with decrease in muscle tension noted    PATIENT EDUCATION:  Education details: eval findings, FOTO, HEP, POC Person educated: Patient Education method: Explanation, Demonstration, and Handouts Education comprehension: verbalized understanding and returned demonstration  HOME EXERCISE PROGRAM: Access Code: [email or web address] URL: [email or web address] Date: 06/05/2023 Prepared by: Edwinna Areola  Exercises - Supine Chin Tuck  - 1 x daily - 7 x weekly - 2 sets - 10 reps - 5 sec hold - Standing Shoulder Row with Anchored Resistance  - 1 x daily - 7 x weekly - 3 sets - 10 reps - black band hold - Seated Upper Trapezius Stretch (Mirrored)  - 1 x daily - 7 x weekly - 2 reps - 30 sec hold - Cat Cow  - 1 x daily - 7 x weekly - 2 sets - 10 reps - Prone Scapular Retraction Y  - 1 x daily - 7 x weekly - 2 sets - 10 reps  ASSESSMENT:  CLINICAL IMPRESSION: Pt was able to complete all prescribed exercises with no adverse effect. Therapy focused on improving periscapular and DNF strength in order to decrease neck pain. Responded well to manual and TPDN noting decrease in tension post session. Continues to benefit from skilled PT, will progress as able.   OBJECTIVE IMPAIRMENTS: decreased activity tolerance, decreased endurance, decreased mobility, decreased ROM, decreased strength, postural dysfunction, and pain   ACTIVITY LIMITATIONS: carrying, lifting, and reach over head  PARTICIPATION LIMITATIONS: driving, shopping, community activity, occupation, and yard work  PERSONAL FACTORS: Time since onset of injury/illness/exacerbation are also affecting patient's functional outcome.   REHAB POTENTIAL: Excellent  CLINICAL DECISION MAKING: Stable/uncomplicated  EVALUATION COMPLEXITY: Low   GOALS: Goals reviewed with patient? No  SHORT TERM GOALS: Target  date: 06/26/2023   Pt will be compliant and knowledgeable with initial HEP for improved comfort and carryover Baseline: initial HEP given  Goal status: INITIAL  2.  Pt will self report neck pain no greater than 4/10 for improved comfort and functional ability Baseline: 6/10 at worst Goal status: INITIAL   LONG TERM GOALS: Target date: 07/17/2023   Pt will improve FOTO function score to no less than 66% as proxy for functional improvement Baseline: 61% function Goal status: INITIAL   2.  Pt will self report neck pain no greater than 1-2/10 for improved comfort and functional ability Baseline: 6/10 at worst Goal status: INITIAL   3.  Pt will improve bilateral cervical rotation ROM too less than 80 degrees for improved functional ability with driving and school activities Baseline:  Goal status: INITIAL  4.  Pt will improve all UE MMT to no less than 4/5 for improved postural endurance and decrease neck pain Baseline:  Goal status: INITIAL   PLAN:  PT FREQUENCY: 1x/week  PT DURATION: 6 weeks  PLANNED INTERVENTIONS: 97164- PT Re-evaluation, 97110-Therapeutic exercises, 97530- Therapeutic activity, 97112- Neuromuscular re-education, 97535- Self Care, 16109- Manual therapy, 97014- Electrical stimulation (unattended), [id number]- Electrical stimulation (manual), Dry Needling, Cryotherapy, and Moist heat  PLAN FOR NEXT SESSION: assess HEP response,  periscapular strengthening, DNF exercises, TPDN   Eloy End, PT 06/30/2023, 11:45 AM

## 2023-07-08 NOTE — Progress Notes (Signed)
 Chief Complaint:left sided abd pain, nausea Primary GI Doctor: Dr. Leigh  HPI: Patient is a 23 year old female patient with past medical history of eczema,asthma, who was referred to me by Almarie Waddell NOVAK, NP on 05/21/23 for a complaint of left sided abd pain, nausea .    On 05/21/2023 patient seen by PCP and complained of persistent abdominal discomfort for the past 3 to 4 weeks.  The discomfort localized to the left side migrates towards the center of the abdomen.  Patient also reports feeling nauseous.  Patient follows a bland diet and suspects a possible intolerance to gluten.  Previous imaging (abdominal x-ray) is in stool but no obstruction.  Celiac testing negative a few months ago.  CT scan of abdomen and pelvis ordered at that time.  Referral to GI.  Interval History   Patient denies GERD or dysphagia.Patient had nausea back in November for a few weeks after every meal. She reports the nausea has completely resolved. Cutting out gluten has helped with nausea and bloating. She also notices more bloating with raw uncooked vegetables. Weight stable. Appetite good.  Patient reports she has bowel movement every 0-5 days. Sometimes she will go daily with small amount.  She has abdominal discomfort intermittently described as stabbing. She reports walking helps and having large bowel movement helps. She reports sometimes she passes large hard stools which causes rectal pain and pink tinge dots with wiping on toilet paper. No pain today. Socially drinks alcohol. Nonsmoker. Denies NSAID use. Never had EGD or colonoscopy. No known family history of GI issues or CA. She does admit she tends to be stressful person and feels symptoms can increase with stress.  Wt Readings from Last 3 Encounters:  07/11/23 143 lb (64.9 kg)  05/21/23 141 lb 12.8 oz (64.3 kg)  05/13/23 143 lb (64.9 kg)    Past Medical History:  Diagnosis Date   Allergic conjunctivitis 11/10/2018   Allergy     seasonal   Anxiety     Asthma    Bursitis of intermetatarsal bursa of left foot 09/18/2018   Chronic headaches    Eczema    History of food allergy  11/10/2018   Kidney stone    Perennial allergic rhinitis 03/21/2015   Reactive airway disease 10/29/2018   Symptom of wheezing 10/29/2018   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   Urticaria    Well child visit 03/21/2015    Past Surgical History:  Procedure Laterality Date   WISDOM TOOTH EXTRACTION      Current Outpatient Medications  Medication Sig Dispense Refill   cetirizine (ZYRTEC) 10 MG tablet Take 10 mg by mouth daily as needed.     cyclobenzaprine  (FLEXERIL ) 5 MG tablet Take 1 tablet (5 mg total) by mouth 3 (three) times daily as needed for muscle spasms. 30 tablet 1   Lactic Ac-Citric Ac-Pot Bitart (PHEXXI ) 1.8-1-0.4 % GEL Place 5 g vaginally as directed. Before intercourse 120 g 11   meloxicam  (MOBIC ) 15 MG tablet Take 1 tablet (15 mg total) by mouth daily. (Patient taking differently: Take 15 mg by mouth daily as needed.) 30 tablet 0   ondansetron  (ZOFRAN -ODT) 4 MG disintegrating tablet Take 1 tablet (4 mg total) by mouth every 6 (six) hours as needed for nausea. 20 tablet 0   SUMAtriptan  (IMITREX ) 50 MG tablet Take 1 tablet (50 mg total) by mouth every 2 (two) hours as needed for migraine. Hamdi Vari repeat in 2 hours if headache persists or recurs. 10 tablet 6   Probiotic Product (  PROBIOTIC PO) Take by mouth. (Patient not taking: Reported on 07/11/2023)     No current facility-administered medications for this visit.    Allergies as of 07/11/2023 - Review Complete 07/11/2023  Allergen Reaction Noted   Justicia adhatoda (malabar nut tree) [justicia adhatoda] Hives 10/07/2022   Other  12/07/2022   Augmentin  [amoxicillin -pot clavulanate] Rash 02/06/2023    Family History  Problem Relation Age of Onset   Arthritis Mother    Asthma Mother    Hyperlipidemia Father    Allergic rhinitis Brother    Asthma Maternal Grandmother    Asthma Maternal Grandfather    Breast  cancer Paternal Grandmother 40   Breast cancer Maternal Aunt    Colon cancer Neg Hx    Esophageal cancer Neg Hx    Rectal cancer Neg Hx     Review of Systems:    Constitutional: No weight loss, fever, chills, weakness or fatigue HEENT: Eyes: No change in vision               Ears, Nose, Throat:  No change in hearing or congestion Skin: No rash or itching Cardiovascular: No chest pain, chest pressure or palpitations   Respiratory: No SOB or cough Gastrointestinal: See HPI and otherwise negative Genitourinary: No dysuria or change in urinary frequency Neurological: No headache, dizziness or syncope Musculoskeletal: No new muscle or joint pain Hematologic: No bleeding or bruising Psychiatric: No history of depression or anxiety    Physical Exam:  Vital signs: BP 100/60   Pulse (!) 102   Ht 5' 6 (1.676 m)   Wt 143 lb (64.9 kg)   BMI 23.08 kg/m   Constitutional:   Pleasant female appears to be in NAD, Well developed, Well nourished, alert and cooperative Neck:  Supple Throat: Oral cavity and pharynx without inflammation, swelling or lesion.  Respiratory: Respirations even and unlabored. Lungs clear to auscultation bilaterally.   No wheezes, crackles, or rhonchi.  Cardiovascular: Normal S1, S2. Regular rate and rhythm. No peripheral edema, cyanosis or pallor.  Gastrointestinal:  Soft, nondistended, generalized abd tenderness with palpation. No rebound or guarding. Normal bowel sounds. No appreciable masses or hepatomegaly. Rectal:  Not performed.  Skin:   Dry and intact without significant lesions or rashes. Psychiatric: Oriented to person, place and time. Demonstrates good judgement and reason without abnormal affect or behaviors.  RELEVANT LABS AND IMAGING: CBC    Latest Ref Rng & Units 05/07/2023    2:13 PM 01/20/2023    1:13 AM 10/07/2022    9:56 AM  CBC  WBC 4.0 - 10.5 K/uL 6.8  6.9  5.0   Hemoglobin 12.0 - 15.0 g/dL 86.0  86.2  86.1   Hematocrit 36.0 - 46.0 % 41.7   41.5  41.1   Platelets 150.0 - 400.0 K/uL 215.0  197  195.0      CMP     Latest Ref Rng & Units 05/07/2023    2:13 PM 01/20/2023    1:13 AM 10/07/2022    9:56 AM  CMP  Glucose 70 - 99 mg/dL 95  894  93   BUN 6 - 23 mg/dL 8  8  10    Creatinine 0.40 - 1.20 mg/dL 9.38  9.24  9.29   Sodium 135 - 145 mEq/L 138  136  138   Potassium 3.5 - 5.1 mEq/L 3.4  3.5  4.0   Chloride 96 - 112 mEq/L 106  105  105   CO2 19 - 32 mEq/L 23  24  25   Calcium 8.4 - 10.5 mg/dL 9.3  9.4  9.3   Total Protein 6.0 - 8.3 g/dL 6.9   6.7   Total Bilirubin 0.2 - 1.2 mg/dL 0.9   0.6   Alkaline Phos 39 - 117 U/L 43   45   AST 0 - 37 U/L 11   13   ALT 0 - 35 U/L 9   9     Lab Results  Component Value Date   TSH 2.34 10/07/2022  01/13/23 labs show: Transglutaminase IgA  <2, IgG 2 05/07/23 labs show: GGT 9 05/07/23 h. Pylori breath test not detected  05/29/23 CT ABD/pelvis IMPRESSION: 1. Constipation. 2. Annular pancreas. 3. 10 cm cystic mass in the right adnexa. Similar finding in 2019 consistent with a benign process.   Assessment: Encounter Diagnoses  Name Primary?   Chronic constipation Yes   Bloating       23 year old female patient with constipation, bloating, and abdominal discomfort. TSH normal. Celiac normal. CT abd/pelvis showed constipation. 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 I suspect her symptoms relieved with defecation are steaming from her constipation. We will maximize her bowel regimen and start her on Citrucel 1 tsp po daily and Miralax po daily. Will give her handout on low fodmap diet and IB gard samples to take with meals. Follow-up in few months.  Plan: -Start Citrucel 1 tsp po daily  -Samples of Ibgard samples -Handout with low fodmap diet -Start Miralax po once daily  -Follow-up in 3 mths with me  Thank you for the courtesy of this consult. Please call me with any questions or concerns.    Kambria Grima, FNP-C [facility] 07/11/2023, 11:52 AM  Cc: Almarie Waddell NOVAK, NP

## 2023-07-10 ENCOUNTER — Ambulatory Visit: Payer: Medicaid Other | Admitting: Gastroenterology

## 2023-07-11 ENCOUNTER — Ambulatory Visit: Payer: Medicaid Other | Attending: Sports Medicine

## 2023-07-11 ENCOUNTER — Encounter: Payer: Self-pay | Admitting: Gastroenterology

## 2023-07-11 ENCOUNTER — Ambulatory Visit: Payer: Medicaid Other | Admitting: Gastroenterology

## 2023-07-11 VITALS — BP 100/60 | HR 102 | Ht 66.0 in | Wt 143.0 lb

## 2023-07-11 DIAGNOSIS — K5909 Other constipation: Secondary | ICD-10-CM | POA: Diagnosis not present

## 2023-07-11 DIAGNOSIS — R140 Abdominal distension (gaseous): Secondary | ICD-10-CM

## 2023-07-11 DIAGNOSIS — R293 Abnormal posture: Secondary | ICD-10-CM | POA: Diagnosis present

## 2023-07-11 DIAGNOSIS — M542 Cervicalgia: Secondary | ICD-10-CM | POA: Insufficient documentation

## 2023-07-11 DIAGNOSIS — M6281 Muscle weakness (generalized): Secondary | ICD-10-CM | POA: Insufficient documentation

## 2023-07-11 NOTE — Progress Notes (Signed)
 Agree with assessment and plan as outlined.

## 2023-07-11 NOTE — Therapy (Signed)
 OUTPATIENT PHYSICAL THERAPY TREATMENT   Patient Name: Stacey Moss MRN: [id number] DOB:2000/06/26, 23 y.o., female Today's Date: 07/11/2023  END OF SESSION:  PT End of Session - 07/11/23 0805     Visit Number 3    Number of Visits 7    Date for PT Re-Evaluation 07/31/23    Authorization Type [employer]    PT Start Time 0804    PT Stop Time 0844    PT Time Calculation (min) 40 min    Activity Tolerance Patient tolerated treatment well    Behavior During Therapy [facility] for tasks assessed/performed               Past Medical History:  Diagnosis Date   Allergic conjunctivitis 11/10/2018   Allergy     seasonal   Asthma    Bursitis of intermetatarsal bursa of left foot 09/18/2018   Eczema    History of food allergy  11/10/2018   Perennial allergic rhinitis 03/21/2015   Reactive airway disease 10/29/2018   Symptom of wheezing 10/29/2018   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   Urticaria    Well child visit 03/21/2015   Past Surgical History:  Procedure Laterality Date   WISDOM TOOTH EXTRACTION     Patient Active Problem List   Diagnosis Date Noted   Chronic migraine w/o aura w/o status migrainosus, not intractable 04/07/2023   Anxiety 04/07/2023   Chronic pain of left knee 11/29/2022   Seasonal allergic rhinitis due to pollen 10/07/2022   Moderate persistent asthma 11/10/2018   Atopic dermatitis 11/10/2018    PCP: Almarie Waddell NOVAK, NP   REFERRING PROVIDER: Almarie Waddell NOVAK, NP   REFERRING DIAG: M54.2 (ICD-10-CM) - Neck pain on left side M79.602 (ICD-10-CM) - Pain of left upper extremity  THERAPY DIAG:  Cervicalgia  Muscle weakness (generalized)  Abnormal posture  Rationale for Evaluation and Treatment: Rehabilitation  ONSET DATE: Chronic  SUBJECTIVE:                                                                                                                                                                                                         SUBJECTIVE STATEMENT: Pt  presents to PT with reports of no current pain. Has been compliant with HEP. Denies symptoms of overt HA lately.  Hand dominance: Right  PERTINENT HISTORY:  See PMH  PAIN:  Are you having pain?  Yes: NPRS scale: 0/10 Worst: 6/10 Pain location: L side of neck, L periscapular Pain description: spasm, tight Aggravating factors: studying/school work, sleeping Relieving factors: heat, stretching  PRECAUTIONS: None  RED FLAGS: None  WEIGHT BEARING RESTRICTIONS: No  FALLS:  Has patient fallen in last 6 months? No  LIVING ENVIRONMENT: Lives with: lives with their family Lives in: House/apartment  OCCUPATION: It trainer at [employer]  PLOF: Independent  PATIENT GOALS: decrease neck and L shoulder pain;   NEXT MD VISIT: None scheduled  OBJECTIVE:  Note: Objective measures were completed at Evaluation unless otherwise noted.  DIAGNOSTIC FINDINGS:  See imaging  PATIENT SURVEYS:  FOTO: 61% function; 66% predicted  COGNITION: Overall cognitive status: Within functional limits for tasks assessed  SENSATION: WFL  POSTURE: rounded shoulders, forward head, and increased thoracic kyphosis  PALPATION: TTP to L upper trap, L cervical paraspinals   CERVICAL ROM:   Active ROM A/PROM (deg) eval  Flexion   Extension   Right lateral flexion   Left lateral flexion   Right rotation 70  Left rotation 65   (Blank rows = not tested)  UPPER EXTREMITY ROM:  Active ROM Right eval Left eval  Shoulder flexion [facility]  Shoulder extension    Shoulder abduction    Shoulder adduction    Shoulder extension    Shoulder internal rotation    Shoulder external rotation    Elbow flexion    Elbow extension    Wrist flexion    Wrist extension    Wrist ulnar deviation    Wrist radial deviation    Wrist pronation    Wrist supination     (Blank rows = not tested)  UPPER EXTREMITY MMT:  MMT Right eval Left eval  Shoulder flexion    Shoulder extension    Shoulder  abduction    Shoulder adduction    Shoulder extension    Shoulder internal rotation    Shoulder external rotation    Middle trapezius 3+/5 3+/5  Lower trapezius 3+/5 3+/5  Elbow flexion    Elbow extension    Wrist flexion    Wrist extension    Wrist ulnar deviation    Wrist radial deviation    Wrist pronation    Wrist supination    Grip strength     (Blank rows = not tested)  CERVICAL SPECIAL TESTS:  Upper limb tension test (ULTT): Negative  FUNCTIONAL TESTS:  DNT  TREATMENT: [facility] Adult PT Treatment:                                                DATE: 07/11/2023 Therapeutic Exercise: UBE lvl 2.0 x 4 min while taking subjective FM row 2x12 13#  Supine horizontal abd 2x15 GTB S/L open book x 10  Prone IYT 2x10 POE chin tuck 2x10 Seated bilateral ER 2x15 GTB Seated low row 2x10 35# Seated lat pulldown 2x8 45# Manual Therapy: Suboccpittial release Positional release to L upper trap Skilled palpation of trigger points for TPDN Trigger Point Dry Needling:  Initial Treatment: Pt instructed on Dry Needling rational, procedures, and possible side effects. Pt instructed to expect mild to moderate muscle soreness later in the day and/or into the next day.   Patient Verbal Consent Given: Yes Education Handout Provided: Yes Muscles Treated: left upper trapezius, left suboccipitals Electrical Stimulation Performed: No Treatment Response/Outcome: twitch response elicited with decrease in muscle tension noted  [facility] Adult PT Treatment:  DATE: 06/30/2023 Therapeutic Exercise: UBE lvl 1.5 x 4 min while taking subjective Seated low row 2x10 25# Seated lat pulldown 3x8 35# Seated bilateral ER 2x15 GTB Seated horizontal abd 2x15 GTB S/L open book x 10 each POE chin tuck 2x10 Manual Therapy: Suboccpittial release Positional release to L upper trap Skilled palpation of trigger points for TPDN Trigger Point Dry Needling:  Initial  Treatment: Pt instructed on Dry Needling rational, procedures, and possible side effects. Pt instructed to expect mild to moderate muscle soreness later in the day and/or into the next day.   Patient Verbal Consent Given: Yes Education Handout Provided: Yes Muscles Treated: left upper trapezius Electrical Stimulation Performed: No Treatment Response/Outcome: twitch response elicited with decrease in muscle tension noted  [facility] Adult PT Treatment:                                                DATE: 06/05/2023 Manual Therapy: Suboccpittial release Positional release to L upper trap Manual Therapy: MHP to cervical paraspinals post session Trigger Point Dry Needling:  Initial Treatment: Pt instructed on Dry Needling rational, procedures, and possible side effects. Pt instructed to expect mild to moderate muscle soreness later in the day and/or into the next day.   Patient Verbal Consent Given: Yes Education Handout Provided: Yes Muscles Treated: left upper trapezius Electrical Stimulation Performed: No Treatment Response/Outcome: twitch response elicited with decrease in muscle tension noted    PATIENT EDUCATION:  Education details: continue HEP Person educated: Patient Education method: Programmer, Multimedia, Facilities Manager, and Handouts Education comprehension: verbalized understanding and returned demonstration  HOME EXERCISE PROGRAM: Access Code: [email or web address] URL: [email or web address] Date: 06/05/2023 Prepared by: Alm Kingdom  Exercises - Supine Chin Tuck  - 1 x daily - 7 x weekly - 2 sets - 10 reps - 5 sec hold - Standing Shoulder Row with Anchored Resistance  - 1 x daily - 7 x weekly - 3 sets - 10 reps - black band hold - Seated Upper Trapezius Stretch (Mirrored)  - 1 x daily - 7 x weekly - 2 reps - 30 sec hold - Cat Cow  - 1 x daily - 7 x weekly - 2 sets - 10 reps - Prone Scapular Retraction Y  - 1 x daily - 7 x weekly - 2 sets - 10 reps  ASSESSMENT:  CLINICAL  IMPRESSION: Pt was able to complete all prescribed exercises with no adverse effect. Therapy focused on improving DNF and periscapular strength in order to decrease neck pain and improve posture. Pt once again responded well to manual therapy and TPDN interventions noting decrease tension post session. Overall she is progressing well with therapy, will continue per POC.   OBJECTIVE IMPAIRMENTS: decreased activity tolerance, decreased endurance, decreased mobility, decreased ROM, decreased strength, postural dysfunction, and pain   ACTIVITY LIMITATIONS: carrying, lifting, and reach over head  PARTICIPATION LIMITATIONS: driving, shopping, community activity, occupation, and yard work  PERSONAL FACTORS: Time since onset of injury/illness/exacerbation are also affecting patient's functional outcome.   REHAB POTENTIAL: Excellent  CLINICAL DECISION MAKING: Stable/uncomplicated  EVALUATION COMPLEXITY: Low   GOALS: Goals reviewed with patient? No  SHORT TERM GOALS: Target date: 06/26/2023   Pt will be compliant and knowledgeable with initial HEP for improved comfort and carryover Baseline: initial HEP given  Goal status: INITIAL  2.  Pt will self report neck pain no greater than  4/10 for improved comfort and functional ability Baseline: 6/10 at worst Goal status: INITIAL   LONG TERM GOALS: Target date: 07/17/2023   Pt will improve FOTO function score to no less than 66% as proxy for functional improvement Baseline: 61% function Goal status: INITIAL   2.  Pt will self report neck pain no greater than 1-2/10 for improved comfort and functional ability Baseline: 6/10 at worst Goal status: INITIAL   3.  Pt will improve bilateral cervical rotation ROM too less than 80 degrees for improved functional ability with driving and school activities Baseline:  Goal status: INITIAL  4.  Pt will improve all UE MMT to no less than 4/5 for improved postural endurance and decrease neck  pain Baseline:  Goal status: INITIAL   PLAN:  PT FREQUENCY: 1x/week  PT DURATION: 6 weeks  PLANNED INTERVENTIONS: 97164- PT Re-evaluation, 97110-Therapeutic exercises, 97530- Therapeutic activity, 97112- Neuromuscular re-education, 97535- Self Care, 02859- Manual therapy, 97014- Electrical stimulation (unattended), [id number]- Electrical stimulation (manual), Dry Needling, Cryotherapy, and Moist heat  PLAN FOR NEXT SESSION: assess HEP response, periscapular strengthening, DNF exercises, TPDN   Alm JAYSON Kingdom, PT 07/11/2023, 8:45 AM

## 2023-07-11 NOTE — Patient Instructions (Signed)
 Please purchase the following medications over the counter and take as directed: Citrucel: 1 teaspoon Miralax: 1 capful daily  We have given you samples of the following medication to take:  IBGard  We have inserted a LOW-FOD Map Diet handout for you to look over.  _______________________________________________________  If your blood pressure at your visit was 140/90 or greater, please contact your primary care physician to follow up on this.  _______________________________________________________  If you are age 59 or older, your body mass index should be between 23-30. Your Body mass index is 23.08 kg/m. If this is out of the aforementioned range listed, please consider follow up with your Primary Care Provider.  If you are age 57 or younger, your body mass index should be between 19-25. Your Body mass index is 23.08 kg/m. If this is out of the aformentioned range listed, please consider follow up with your Primary Care Provider.   ________________________________________________________  The [facility] providers would like to encourage you to use MYCHART to communicate with providers for non-urgent requests or questions.  Due to long hold times on the telephone, sending your provider a message by [facility] may be a faster and more efficient way to get a response.  Please allow 48 business hours for a response.  Please remember that this is for non-urgent requests.  _______________________________________________________  Thank you for trusting me with your gastrointestinal care!   Deanna May, NP

## 2023-07-14 ENCOUNTER — Ambulatory Visit: Payer: Medicaid Other | Admitting: Physical Therapy

## 2023-07-21 ENCOUNTER — Ambulatory Visit: Payer: Medicaid Other

## 2023-07-21 DIAGNOSIS — M6281 Muscle weakness (generalized): Secondary | ICD-10-CM

## 2023-07-21 DIAGNOSIS — M542 Cervicalgia: Secondary | ICD-10-CM | POA: Diagnosis not present

## 2023-07-21 DIAGNOSIS — R293 Abnormal posture: Secondary | ICD-10-CM

## 2023-07-21 NOTE — Therapy (Addendum)
 OUTPATIENT PHYSICAL THERAPY TREATMENT NOTE/DISCHARGE  PHYSICAL THERAPY DISCHARGE SUMMARY  Visits from Start of Care: 4  Current functional level related to goals / functional outcomes: See goals/objective   Remaining deficits: Unable to assess   Education / Equipment: HEP   Patient agrees to discharge. Patient goals were unable to assess. Patient is being discharged due to not returning since the last visit.     Patient Name: Stacey Moss MRN: [id number] DOB:2000/08/26, 23 y.o., female Today's Date: 07/21/2023  END OF SESSION:  PT End of Session - 07/21/23 0940     Visit Number 4    Number of Visits 7    Date for PT Re-Evaluation 08/18/23    Authorization Type [employer]    PT Start Time 0940    PT Stop Time 1012    PT Time Calculation (min) 32 min    Activity Tolerance Patient tolerated treatment well    Behavior During Therapy [facility] for tasks assessed/performed                Past Medical History:  Diagnosis Date   Allergic conjunctivitis 11/10/2018   Allergy     seasonal   Anxiety    Asthma    Bursitis of intermetatarsal bursa of left foot 09/18/2018   Chronic headaches    Eczema    History of food allergy  11/10/2018   Kidney stone    Perennial allergic rhinitis 03/21/2015   Reactive airway disease 10/29/2018   Symptom of wheezing 10/29/2018   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   Urticaria    Well child visit 03/21/2015   Past Surgical History:  Procedure Laterality Date   WISDOM TOOTH EXTRACTION     Patient Active Problem List   Diagnosis Date Noted   Chronic migraine w/o aura w/o status migrainosus, not intractable 04/07/2023   Anxiety 04/07/2023   Chronic pain of left knee 11/29/2022   Seasonal allergic rhinitis due to pollen 10/07/2022   Moderate persistent asthma 11/10/2018   Atopic dermatitis 11/10/2018    PCP: Almarie Waddell NOVAK, NP   REFERRING PROVIDER: Almarie Waddell NOVAK, NP   REFERRING DIAG: M54.2 (ICD-10-CM) - Neck pain on left side M79.602  (ICD-10-CM) - Pain of left upper extremity  THERAPY DIAG:  Cervicalgia  Muscle weakness (generalized)  Abnormal posture  Rationale for Evaluation and Treatment: Rehabilitation  ONSET DATE: Chronic  SUBJECTIVE:  SUBJECTIVE STATEMENT: Pt presents to PT with reports of increased neck pain yesterday, decrease today. Has been compliant with HEP.   Hand dominance: Right  PERTINENT HISTORY:  See PMH  PAIN:  Are you having pain?  Yes: NPRS scale: 0/10 Worst: 6/10 Pain location: L side of neck, L periscapular Pain description: spasm, tight Aggravating factors: studying/school work, sleeping Relieving factors: heat, stretching  PRECAUTIONS: None  RED FLAGS: None     WEIGHT BEARING RESTRICTIONS: No  FALLS:  Has patient fallen in last 6 months? No  LIVING ENVIRONMENT: Lives with: lives with their family Lives in: House/apartment  OCCUPATION: IT trainer at [employer]  PLOF: Independent  PATIENT GOALS: decrease neck and L shoulder pain;   NEXT MD VISIT: None scheduled  OBJECTIVE:  Note: Objective measures were completed at Evaluation unless otherwise noted.  DIAGNOSTIC FINDINGS:  See imaging  PATIENT SURVEYS:  FOTO: 61% function; 66% predicted  COGNITION: Overall cognitive status: Within functional limits for tasks assessed  SENSATION: WFL  POSTURE: rounded shoulders, forward head, and increased thoracic kyphosis  PALPATION: TTP to L upper trap, L cervical paraspinals   CERVICAL ROM:   Active ROM A/PROM (deg) eval  Flexion   Extension   Right lateral flexion   Left lateral flexion   Right rotation 70  Left rotation 65   (Blank rows = not tested)  UPPER EXTREMITY ROM:  Active ROM Right eval Left eval  Shoulder flexion [facility]  Shoulder  extension    Shoulder abduction    Shoulder adduction    Shoulder extension    Shoulder internal rotation    Shoulder external rotation    Elbow flexion    Elbow extension    Wrist flexion    Wrist extension    Wrist ulnar deviation    Wrist radial deviation    Wrist pronation    Wrist supination     (Blank rows = not tested)  UPPER EXTREMITY MMT:  MMT Right eval Left eval  Shoulder flexion    Shoulder extension    Shoulder abduction    Shoulder adduction    Shoulder extension    Shoulder internal rotation    Shoulder external rotation    Middle trapezius 3+/5 3+/5  Lower trapezius 3+/5 3+/5  Elbow flexion    Elbow extension    Wrist flexion    Wrist extension    Wrist ulnar deviation    Wrist radial deviation    Wrist pronation    Wrist supination    Grip strength     (Blank rows = not tested)  CERVICAL SPECIAL TESTS:  Upper limb tension test (ULTT): Negative  FUNCTIONAL TESTS:  DNT  TREATMENT: [facility] Adult PT Treatment:                                                DATE: 07/11/2023 Therapeutic Exercise: UBE lvl 2.0 x 3 min while taking subjective FM row 2x12 20#  Shoulder flexion 2x10 2# with mirror for biofeedback Shoulder abduction 2x10 2# with mirror for biofeedback Prone IYT 2x10 2# POE chin tuck 2x10 Manual Therapy: Suboccpittial release Positional release to L upper trap Skilled palpation of trigger points for TPDN Trigger Point Dry Needling:  Subsequent Treatment: Pt instructed on Dry Needling rational, procedures, and possible side effects. Pt instructed to expect mild to moderate muscle soreness later in the  day and/or into the next day.   Patient Verbal Consent Given: Yes Education Handout Provided: No - Previousy provided Muscles Treated: left upper trapezius Electrical Stimulation Performed: No Treatment Response/Outcome: twitch response elicited with decrease in muscle tension noted  [facility] Adult PT Treatment:                                                 DATE: 06/30/2023 Therapeutic Exercise: UBE lvl 1.5 x 4 min while taking subjective Seated low row 2x10 25# Seated lat pulldown 3x8 35# Seated bilateral ER 2x15 GTB Seated horizontal abd 2x15 GTB S/L open book x 10 each POE chin tuck 2x10 Manual Therapy: Suboccpittial release Positional release to L upper trap Skilled palpation of trigger points for TPDN Trigger Point Dry Needling:  Initial Treatment: Pt instructed on Dry Needling rational, procedures, and possible side effects. Pt instructed to expect mild to moderate muscle soreness later in the day and/or into the next day.   Patient Verbal Consent Given: Yes Education Handout Provided: Yes Muscles Treated: left upper trapezius Electrical Stimulation Performed: No Treatment Response/Outcome: twitch response elicited with decrease in muscle tension noted  [facility] Adult PT Treatment:                                                DATE: 06/05/2023 Manual Therapy: Suboccpittial release Positional release to L upper trap Manual Therapy: MHP to cervical paraspinals post session Trigger Point Dry Needling:  Initial Treatment: Pt instructed on Dry Needling rational, procedures, and possible side effects. Pt instructed to expect mild to moderate muscle soreness later in the day and/or into the next day.   Patient Verbal Consent Given: Yes Education Handout Provided: Yes Muscles Treated: left upper trapezius Electrical Stimulation Performed: No Treatment Response/Outcome: twitch response elicited with decrease in muscle tension noted    PATIENT EDUCATION:  Education details: continue HEP Person educated: Patient Education method: Programmer, multimedia, Facilities manager, and Handouts Education comprehension: verbalized understanding and returned demonstration  HOME EXERCISE PROGRAM: Access Code: [email or web address] URL: [email or web address] Date: 06/05/2023 Prepared by: Alm Kingdom  Exercises - Supine Chin Tuck  -  1 x daily - 7 x weekly - 2 sets - 10 reps - 5 sec hold - Standing Shoulder Row with Anchored Resistance  - 1 x daily - 7 x weekly - 3 sets - 10 reps - black band hold - Seated Upper Trapezius Stretch (Mirrored)  - 1 x daily - 7 x weekly - 2 reps - 30 sec hold - Cat Cow  - 1 x daily - 7 x weekly - 2 sets - 10 reps - Prone Scapular Retraction Y  - 1 x daily - 7 x weekly - 2 sets - 10 reps  ASSESSMENT:  CLINICAL IMPRESSION: Pt was able to complete all prescribed exercises with no adverse effect. Therapy focused on improving DNF and periscapular strength in order to decrease neck pain and improve posture. Pt once again responded well to manual therapy and TPDN interventions noting decrease tension post session. Overall she is progressing with therapy, will continue per POC.   OBJECTIVE IMPAIRMENTS: decreased activity tolerance, decreased endurance, decreased mobility, decreased ROM, decreased strength, postural dysfunction, and pain   ACTIVITY LIMITATIONS: carrying, lifting, and reach over  head  PARTICIPATION LIMITATIONS: driving, shopping, community activity, occupation, and yard work  PERSONAL FACTORS: Time since onset of injury/illness/exacerbation are also affecting patient's functional outcome.   REHAB POTENTIAL: Excellent  CLINICAL DECISION MAKING: Stable/uncomplicated  EVALUATION COMPLEXITY: Low   GOALS: Goals reviewed with patient? No  SHORT TERM GOALS: Target date: 06/26/2023   Pt will be compliant and knowledgeable with initial HEP for improved comfort and carryover Baseline: initial HEP given  Goal status: MET  2.  Pt will self report neck pain no greater than 4/10 for improved comfort and functional ability Baseline: 6/10 at worst Goal status: IN PROGRESS   LONG TERM GOALS: Target date: 08/18/2023    Pt will improve FOTO function score to no less than 66% as proxy for functional improvement Baseline: 61% function Goal status: IN PROGRESS   2.  Pt will self report  neck pain no greater than 1-2/10 for improved comfort and functional ability Baseline: 6/10 at worst Goal status: IN PROGRESS   3.  Pt will improve bilateral cervical rotation ROM too less than 80 degrees for improved functional ability with driving and school activities Baseline: see chart Goal status: IN PROGRESS  4.  Pt will improve all UE MMT to no less than 4/5 for improved postural endurance and decrease neck pain Baseline:  Goal status: IN PROGRESS   PLAN:  PT FREQUENCY: 1x/week  PT DURATION: 4 weeks  PLANNED INTERVENTIONS: 97164- PT Re-evaluation, 97110-Therapeutic exercises, 97530- Therapeutic activity, 97112- Neuromuscular re-education, 97535- Self Care, 02859- Manual therapy, 97014- Electrical stimulation (unattended), [id number]- Electrical stimulation (manual), Dry Needling, Cryotherapy, and Moist heat  PLAN FOR NEXT SESSION: assess HEP response, periscapular strengthening, DNF exercises, TPDN   Alm JAYSON Kingdom, PT 07/21/2023, 11:23 AM

## 2023-07-30 ENCOUNTER — Ambulatory Visit: Payer: Medicaid Other | Admitting: Physical Therapy

## 2023-08-04 ENCOUNTER — Ambulatory Visit: Payer: Medicaid Other | Attending: Sports Medicine | Admitting: Physical Therapy

## 2023-08-04 ENCOUNTER — Telehealth: Payer: Self-pay | Admitting: Physical Therapy

## 2023-08-04 NOTE — Telephone Encounter (Signed)
 Spoke with patient regarding missed PT appointment. She stated she though the appointment time was at 9:00am but she also had to go in to work. She did speak with someone at the front desk after the appointment time and has been added to the waitlist for another appointment this week. Patient was reminded of her next scheduled appointment and advised that she would only be able to schedule one visit at a time since this was her 2nd no show. Patient expressed understanding and confirmed next appointment.   Rosana Hoes, PT, DPT, LAT, ATC 08/04/23  9:54 AM Phone: [phone] Fax: [phone]

## 2023-08-11 ENCOUNTER — Ambulatory Visit: Payer: Medicaid Other | Admitting: Physical Therapy

## 2023-10-03 ENCOUNTER — Ambulatory Visit: Payer: Medicaid Other | Admitting: Gastroenterology

## 2023-10-17 ENCOUNTER — Ambulatory Visit: Admitting: Gastroenterology

## 2023-10-17 ENCOUNTER — Encounter: Payer: Self-pay | Admitting: Gastroenterology

## 2023-10-17 VITALS — BP 118/60 | HR 95 | Ht 66.0 in | Wt 145.0 lb

## 2023-10-17 DIAGNOSIS — R140 Abdominal distension (gaseous): Secondary | ICD-10-CM | POA: Diagnosis not present

## 2023-10-17 DIAGNOSIS — K5909 Other constipation: Secondary | ICD-10-CM

## 2023-10-17 NOTE — Progress Notes (Signed)
 Agree with assessment and plan as outlined.

## 2023-10-17 NOTE — Patient Instructions (Addendum)
 Continue fiber supplementation daily Avoid food triggers OTC lactaid if needed Continue Miralax po once daily  _______________________________________________________  If your blood pressure at your visit was 140/90 or greater, please contact your primary care physician to follow up on this.  _______________________________________________________  If you are age 24 or older, your body mass index should be between 23-30. Your Body mass index is 23.4 kg/m. If this is out of the aforementioned range listed, please consider follow up with your Primary Care Provider.  If you are age 20 or younger, your body mass index should be between 19-25. Your Body mass index is 23.4 kg/m. If this is out of the aformentioned range listed, please consider follow up with your Primary Care Provider.   ________________________________________________________  The [facility] providers would like to encourage you to use MYCHART to communicate with providers for non-urgent requests or questions.  Due to long hold times on the telephone, sending your provider a message by [facility] may be a faster and more efficient way to get a response.  Please allow 48 business hours for a response.  Please remember that this is for non-urgent requests.  _______________________________________________________ Thank you for trusting me with your gastrointestinal care. Deanna May, RNP

## 2023-10-17 NOTE — Progress Notes (Signed)
 Chief Complaint: follow up constipation, ab pain, nausea Primary GI Doctor:Dr. General Kenner   HPI: Patient is a 23 year old female patient with past medical history of eczema and asthma, who was referred to me by Everlina Hock, NP on 05/21/23 for a complaint of left sided abd pain, nausea .    On 05/21/2023 patient seen by PCP and complained of persistent abdominal discomfort for the past 3 to 4 weeks.  The discomfort localized to the left side migrates towards the center of the abdomen.  Patient also reports feeling nauseous.  Patient follows a bland diet and suspects a possible intolerance to gluten.  Previous imaging (abdominal x-ray) is in stool but no obstruction.  Celiac testing negative a few months ago.  CT scan of abdomen and pelvis ordered at that time.  Referral to GI.   On 07/11/2023 seen in GI office by myself for left sided abd pain and nausea. Nausea had resolved. Patient had issues with constipation so at that time we maximized her bowel routine by adding Citrucel and Miralax.  Interval History  Patient presents for follow-up on constipation, abd pain, and nausea. She has been doing well on the OTC Miralax po daily. She added Benefiber powder but states it cause some bloating and she didn't  like the taste. Her bowel movements are more regular and this has helped with the abd pain. She also no longer has nausea. She has decreased her dairy consumption which has also helped.   Wt Readings from Last 3 Encounters:  10/17/23 145 lb (65.8 kg)  07/11/23 143 lb (64.9 kg)  05/21/23 141 lb 12.8 oz (64.3 kg)    Past Medical History:  Diagnosis Date   Allergic conjunctivitis 11/10/2018   Allergy     seasonal   Anxiety    Asthma    Bursitis of intermetatarsal bursa of left foot 09/18/2018   Chronic headaches    Eczema    History of food allergy  11/10/2018   Kidney stone    Perennial allergic rhinitis 03/21/2015   Reactive airway disease 10/29/2018   Symptom of wheezing 10/29/2018    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   Urticaria    Well child visit 03/21/2015    Past Surgical History:  Procedure Laterality Date   WISDOM TOOTH EXTRACTION      Current Outpatient Medications  Medication Sig Dispense Refill   cetirizine (ZYRTEC) 10 MG tablet Take 10 mg by mouth daily as needed.     cyclobenzaprine  (FLEXERIL ) 5 MG tablet Take 1 tablet (5 mg total) by mouth 3 (three) times daily as needed for muscle spasms. 30 tablet 1   Lactic Ac-Citric Ac-Pot Bitart (PHEXXI ) 1.8-1-0.4 % GEL Place 5 g vaginally as directed. Before intercourse 120 g 11   meloxicam  (MOBIC ) 15 MG tablet Take 1 tablet (15 mg total) by mouth daily. (Patient taking differently: Take 15 mg by mouth daily as needed.) 30 tablet 0   ondansetron  (ZOFRAN -ODT) 4 MG disintegrating tablet Take 1 tablet (4 mg total) by mouth every 6 (six) hours as needed for nausea. 20 tablet 0   SUMAtriptan  (IMITREX ) 50 MG tablet Take 1 tablet (50 mg total) by mouth every 2 (two) hours as needed for migraine. Jenetta Wease repeat in 2 hours if headache persists or recurs. 10 tablet 6   No current facility-administered medications for this visit.    Allergies as of 10/17/2023 - Review Complete 10/17/2023  Allergen Reaction Noted   Justicia adhatoda (malabar nut tree) [justicia adhatoda] Hives 10/07/2022  Other  12/07/2022   Augmentin  [amoxicillin -pot clavulanate] Rash 02/06/2023    Family History  Problem Relation Age of Onset   Arthritis Mother    Asthma Mother    Hyperlipidemia Father    Allergic rhinitis Brother    Asthma Maternal Grandmother    Asthma Maternal Grandfather    Breast cancer Paternal Grandmother 33   Breast cancer Maternal Aunt    Colon cancer Neg Hx    Esophageal cancer Neg Hx    Rectal cancer Neg Hx     Review of Systems:    Constitutional: No weight loss, fever, chills, weakness or fatigue HEENT: Eyes: No change in vision               Ears, Nose, Throat:  No change in hearing or congestion Skin: No rash or  itching Cardiovascular: No chest pain, chest pressure or palpitations   Respiratory: No SOB or cough Gastrointestinal: See HPI and otherwise negative Genitourinary: No dysuria or change in urinary frequency Neurological: No headache, dizziness or syncope Musculoskeletal: No new muscle or joint pain Hematologic: No bleeding or bruising Psychiatric: No history of depression or anxiety    Physical Exam:  Vital signs: BP 118/60   Pulse 95   Ht 5\' 6"  (1.676 m)   Wt 145 lb (65.8 kg)   BMI 23.40 kg/m   Constitutional:   Pleasant  female appears to be in NAD, Well developed, Well nourished, alert and cooperative Throat: Oral cavity and pharynx without inflammation, swelling or lesion.  Respiratory: Respirations even and unlabored. Lungs clear to auscultation bilaterally.   No wheezes, crackles, or rhonchi.  Cardiovascular: Normal S1, S2. Regular rate and rhythm. No peripheral edema, cyanosis or pallor.  Gastrointestinal:  Soft, nondistended, nontender. No rebound or guarding. Normal bowel sounds. No appreciable masses or hepatomegaly. Rectal:  Not performed.  Msk:  Symmetrical without gross deformities. Without edema, no deformity or joint abnormality.  Neurologic:  Alert and  oriented x4;  grossly normal neurologically.  Skin:   Dry and intact without significant lesions or rashes. Psychiatric: Oriented to person, place and time. Demonstrates good judgement and reason without abnormal affect or behaviors.  RELEVANT LABS AND IMAGING: CBC    Latest Ref Rng & Units 05/07/2023    2:13 PM 01/20/2023    1:13 AM 10/07/2022    9:56 AM  CBC  WBC 4.0 - 10.5 K/uL 6.8  6.9  5.0   Hemoglobin 12.0 - 15.0 g/dL 16.1  09.6  04.5   Hematocrit 36.0 - 46.0 % 41.7  41.5  41.1   Platelets 150.0 - 400.0 K/uL 215.0  197  195.0      CMP     Latest Ref Rng & Units 05/07/2023    2:13 PM 01/20/2023    1:13 AM 10/07/2022    9:56 AM  CMP  Glucose 70 - 99 mg/dL 95  409  93   BUN 6 - 23 mg/dL 8  8  10     Creatinine 0.40 - 1.20 mg/dL 8.11  9.14  7.82   Sodium 135 - 145 mEq/L 138  136  138   Potassium 3.5 - 5.1 mEq/L 3.4  3.5  4.0   Chloride 96 - 112 mEq/L 106  105  105   CO2 19 - 32 mEq/L 23  24  25    Calcium 8.4 - 10.5 mg/dL 9.3  9.4  9.3   Total Protein 6.0 - 8.3 g/dL 6.9   6.7   Total Bilirubin 0.2 -  1.2 mg/dL 0.9   0.6   Alkaline Phos 39 - 117 U/L 43   45   AST 0 - 37 U/L 11   13   ALT 0 - 35 U/L 9   9      Lab Results  Component Value Date   TSH 2.34 10/07/2022   01/13/23 labs show: Transglutaminase IgA  <2, IgG 2 05/07/23 labs show: GGT 9 05/07/23 h. Pylori breath test not detected   05/29/23 CT ABD/pelvis IMPRESSION: 1. Constipation. 2. Annular pancreas. 3. 10 cm cystic mass in the right adnexa. Similar finding in 2019 consistent with a benign process.  Assessment: Encounter Diagnoses  Name Primary?   Chronic constipation Yes   Bloating      23 year old female patient that presents for follow-up for constipation with bloating and nausea.  Since patient has been taking daily MiraLAX and added fiber supplementation her symptoms have improved.  She will continue current regimen.  Plan: -Continue fiber supplementation -Avoid food triggers -Continue Miralax po once daily -Follow up as needed   Thank you for the courtesy of this consult. Please call me with any questions or concerns.   Shanina Kepple, FNP-C  Gastroenterology 10/17/2023, 11:31 AM  Cc: Everlina Hock, NP

## 2023-11-17 ENCOUNTER — Ambulatory Visit: Admitting: Family Medicine

## 2023-11-17 ENCOUNTER — Other Ambulatory Visit (HOSPITAL_BASED_OUTPATIENT_CLINIC_OR_DEPARTMENT_OTHER): Payer: Self-pay

## 2023-11-17 ENCOUNTER — Ambulatory Visit (INDEPENDENT_AMBULATORY_CARE_PROVIDER_SITE_OTHER): Admitting: Family Medicine

## 2023-11-17 VITALS — BP 124/68 | HR 100 | Temp 98.0°F | Resp 16 | Ht 66.0 in | Wt 145.0 lb

## 2023-11-17 DIAGNOSIS — J029 Acute pharyngitis, unspecified: Secondary | ICD-10-CM

## 2023-11-17 LAB — POCT RAPID STREP A (OFFICE): Rapid Strep A Screen: NEGATIVE

## 2023-11-17 MED ORDER — FLUCONAZOLE 150 MG PO TABS
ORAL_TABLET | ORAL | 0 refills | Status: DC
  Filled 2023-11-17: qty 2, 2d supply, fill #0

## 2023-11-17 MED ORDER — AZITHROMYCIN 250 MG PO TABS
ORAL_TABLET | ORAL | 0 refills | Status: AC
  Filled 2023-11-17: qty 6, 5d supply, fill #0

## 2023-11-17 NOTE — Progress Notes (Signed)
 Chief Complaint  Patient presents with   Sinus Problem    Sinus Problems    Stacey Moss here for URI complaints.  Duration: 3 days  Associated symptoms: sinus pain, rhinorrhea, ear pain, sore throat, and white patches in throat Denies: sinus congestion, sinus pain, itchy watery eyes, ear drainage, wheezing, shortness of breath, myalgia, and fevers Treatment to date: hot water, cold items Sick contacts: No  Past Medical History:  Diagnosis Date   Allergic conjunctivitis 11/10/2018   Allergy     seasonal   Anxiety    Asthma    Bursitis of intermetatarsal bursa of left foot 09/18/2018   Chronic headaches    Eczema    History of food allergy  11/10/2018   Kidney stone    Perennial allergic rhinitis 03/21/2015   Reactive airway disease 10/29/2018   Symptom of wheezing 10/29/2018   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   Urticaria    Well child visit 03/21/2015    Objective BP 124/68 (BP Location: Left Arm, Patient Position: Sitting)   Pulse 100   Temp 98 F (36.7 C) (Oral)   Resp 16   Ht 5' 6 (1.676 m)   Wt 145 lb (65.8 kg)   SpO2 98%   BMI 23.40 kg/m  General: Awake, alert, appears stated age HEENT: AT, [place], ears patent b/l and TM's neg, nares patent w/o discharge, pharynx pink and with b/l exudates, MMM, no sinus ttp Neck: No masses or asymmetry Heart: RRR Lungs: CTAB, no accessory muscle use Psych: Age appropriate judgment and insight, normal mood and affect  Sore throat - Plan: azithromycin (ZITHROMAX) 250 MG tablet, fluconazole (DIFLUCAN) 150 MG tablet  S/s's not severe. Will have her use supportive care for now. If no improvement in next 1-2 d, will take Zpak (allergy  to pcn?). Continue to push fluids, practice good hand hygiene, throat lozenges, air humidifier, salt water gargles. F/u prn. If starting to experience fevers, shaking, or shortness of breath, seek immediate care. Pt voiced understanding and agreement to the plan.  Stacey Dials Villas, DO 11/17/23 3:35  PM

## 2023-11-17 NOTE — Addendum Note (Signed)
 Addended by: Ethanjames Fontenot M on: 11/17/2023 03:41 PM   Modules accepted: Orders

## 2023-11-17 NOTE — Patient Instructions (Signed)
 Ibuprofen 400-600 mg (2-3 over the counter strength tabs) every 6 hours as needed for pain.  OK to take Tylenol  1000 mg (2 extra strength tabs) or 975 mg (3 regular strength tabs) every 6 hours as needed.  Consider throat lozenges, salt water gargles and an air humidifier for symptomatic care.   If no improvement in the next 2 days, take the antibiotic.   Let us  know if you need anything.

## 2023-11-18 ENCOUNTER — Encounter: Payer: Self-pay | Admitting: Family Medicine

## 2023-11-18 ENCOUNTER — Telehealth: Payer: Self-pay | Admitting: *Deleted

## 2023-11-18 NOTE — Telephone Encounter (Signed)
 Copied from CRM [id number]. Topic: General - Other >> Nov 18, 2023  4:12 PM Howard Macho wrote: Reason for CRM: patient called stating she saw MD Gwenette Lennox on yesterday and she need work note sent to her mychart or emailed to her CB [phone]

## 2023-11-21 ENCOUNTER — Other Ambulatory Visit: Payer: Self-pay | Admitting: Family Medicine

## 2023-11-21 MED ORDER — PREDNISONE 20 MG PO TABS: 40.0000 mg | ORAL_TABLET | Freq: Every day | ORAL | 0 refills | Status: DC

## 2023-11-24 NOTE — Telephone Encounter (Signed)
 Sent pt message asking if she still need to be seen.

## 2023-12-04 ENCOUNTER — Encounter: Payer: Self-pay | Admitting: Family Medicine

## 2023-12-04 ENCOUNTER — Ambulatory Visit (INDEPENDENT_AMBULATORY_CARE_PROVIDER_SITE_OTHER): Admitting: Family Medicine

## 2023-12-04 VITALS — BP 127/90 | HR 85 | Ht 66.0 in | Wt 144.0 lb

## 2023-12-04 DIAGNOSIS — H6593 Unspecified nonsuppurative otitis media, bilateral: Secondary | ICD-10-CM

## 2023-12-04 NOTE — Progress Notes (Signed)
   Acute Office Visit  Subjective:     Patient ID: Stacey Moss, female    DOB: [birth year], 23 y.o.   MRN: [id number]  Chief Complaint  Patient presents with   Ear Problem    HPI Patient is in today for ear popping.  Discussed the use of AI scribe software for clinical note transcription with the patient, who gave verbal consent to proceed.  History of Present Illness Stacey Moss is a 23 year old female who presents with ear popping and congestion.  She experiences ear popping primarily at night, which began during a recent sore throat/URI treated with a Z-Pak. The illness resolved after about a week and a half, but the ear popping persisted even after the congestion subsided. 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   She also experiences congestion at night, which she attributes to allergies. She uses Zyrtec for her allergies, which helps manage her symptoms. Additionally, she uses Flonase  to manage congestion. She has a history of frequent ear infections as a child, which causes concern for her mother.  No fever or ear pain.       ROS All review of systems negative except what is listed in the HPI      Objective:    BP (!) 127/90   Pulse 85   Ht 5' 6 (1.676 m)   Wt 144 lb (65.3 kg)   SpO2 99%   BMI 23.24 kg/m    Physical Exam Vitals reviewed.  Constitutional:      Appearance: Normal appearance.  HENT:     Head: Normocephalic and atraumatic.     Right Ear: A middle ear effusion is present. Tympanic membrane is not erythematous or retracted.     Left Ear: A middle ear effusion is present. Tympanic membrane is not erythematous or retracted.  Cardiovascular:     Rate and Rhythm: Normal rate and regular rhythm.  Pulmonary:     Effort: Pulmonary effort is normal.     Breath sounds: Normal breath sounds.  Musculoskeletal:     Cervical back: No tenderness.   Lymphadenopathy:     Cervical: No cervical adenopathy.  Skin:    General: Skin is warm and dry.  Neurological:     Mental Status: She is alert and oriented to person, place, and time.  Psychiatric:        Mood and Affect: Mood normal.        Behavior: Behavior normal.        Thought Content: Thought content normal.        Judgment: Judgment normal.     No results found for any visits on 12/04/23.      Assessment & Plan:   Problem List Items Addressed This Visit   None Visit Diagnoses       Fluid level behind tympanic membrane of both ears    -  Primary       Assessment & Plan Eustachian tube dysfunction Ear popping without pain, likely due to congestion and drainage issues. No current infection, but fluid presence suggests Eustachian tube dysfunction.  - Continue Flonase , two sprays in each nostril daily. - Continue Zyrtec for allergies - Monitor for pain or fever, report if symptoms occur.     No orders of the defined types were placed in this encounter.   Return if symptoms worsen or fail to improve.  Waddell KATHEE Mon, NP

## 2024-02-18 ENCOUNTER — Ambulatory Visit: Admitting: Family Medicine

## 2024-04-17 ENCOUNTER — Ambulatory Visit (INDEPENDENT_AMBULATORY_CARE_PROVIDER_SITE_OTHER)

## 2024-04-17 ENCOUNTER — Ambulatory Visit
Admission: EM | Admit: 2024-04-17 | Discharge: 2024-04-17 | Disposition: A | Discharge status: Home / Self Care | Source: Ambulatory Visit | Attending: Internal Medicine | Admitting: Internal Medicine

## 2024-04-17 DIAGNOSIS — M542 Cervicalgia: Secondary | ICD-10-CM

## 2024-04-17 DIAGNOSIS — M79672 Pain in left foot: Secondary | ICD-10-CM

## 2024-04-17 NOTE — Discharge Instructions (Addendum)
 X-ray of the left foot done today.  Final evaluation by the radiologist does not show any fractures or other acute findings.  Symptoms and physical exam findings are most consistent with inflammation around the first distal metatarsal.  We can try to treat this with anti-inflammatories and rest and if there is no improvement may need to either follow-up with urgent care or with orthopedics.  We can try to treat with the following: Ibuprofen 600 to 800 mg every 8 hours as needed for pain. Ice the area 2-3 times daily for 10-15 minutes to help with pain and swelling. Do not apply ice directly to the skin.   Recommend wearing the postoperative shoe for the first 3 to 4 days when up standing or walking then may remove and increase activity as tolerated.  If you are unable to increase activity as tolerated then recommend following up with orthopedics May follow-up with urgent care as needed

## 2024-04-17 NOTE — ED Provider Notes (Signed)
 [facility]    CSN: [id number] Arrival date & time: 04/17/24  1205      History   Chief Complaint Chief Complaint  Patient presents with   Foot Pain    HPI Stacey Moss is a 23 y.o. female.   23 year old female who presents urgent care with complaints of left foot pain along the medial aspect.  She reports that this started 3 days ago.  She did not have any specific injury to the area.  She reports that the pain is mostly right around the distal first metatarsal head and across the top of the foot at the base of the 1st and 2nd toes.  She has not noted any swelling.  She reports the pain is worse when she is walking.  She was concerned because she has had a stress fracture in the past that did not swell.  She also reports that she stands and walks excessively at work and also has an upcoming event in which she will have to stand for long periods of time.   Foot Pain Pertinent negatives include no chest pain, no abdominal pain and no shortness of breath.    Past Medical History:  Diagnosis Date   Allergic conjunctivitis 11/10/2018   Allergy     seasonal   Anxiety    Asthma    Bursitis of intermetatarsal bursa of left foot 09/18/2018   Chronic headaches    Eczema    History of food allergy  11/10/2018   Kidney stone    Perennial allergic rhinitis 03/21/2015   Reactive airway disease 10/29/2018   Symptom of wheezing 10/29/2018   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   Urticaria    Well child visit 03/21/2015    Patient Active Problem List   Diagnosis Date Noted   Chronic migraine w/o aura w/o status migrainosus, not intractable 04/07/2023   Anxiety 04/07/2023   Chronic pain of left knee 11/29/2022   Seasonal allergic rhinitis due to pollen 10/07/2022   Moderate persistent asthma 11/10/2018   Atopic dermatitis 11/10/2018    Past Surgical History:  Procedure Laterality Date   WISDOM TOOTH EXTRACTION      OB History     Gravida  0   Para  0   Term  0   Preterm   0   AB  0   Living  0      SAB  0   IAB  0   Ectopic  0   Multiple  0   Live Births  0            Home Medications    Prior to Admission medications   Medication Sig Start Date End Date Taking? Authorizing Provider  cetirizine (ZYRTEC) 10 MG tablet Take 10 mg by mouth daily as needed.    [provider]  cyclobenzaprine  (FLEXERIL ) 5 MG tablet Take 1 tablet (5 mg total) by mouth 3 (three) times daily as needed for muscle spasms. 04/16/23   Almarie Waddell NOVAK, NP  Lactic Ac-Citric Ac-Pot Bitart (PHEXXI ) 1.8-1-0.4 % GEL Place 5 g vaginally as directed. Before intercourse 02/11/23   Chrzanowski, Jami B, NP  meloxicam  (MOBIC ) 15 MG tablet Take 1 tablet (15 mg total) by mouth daily. Patient taking differently: Take 15 mg by mouth daily as needed. 04/16/23   Almarie Waddell NOVAK, NP  ondansetron  (ZOFRAN -ODT) 4 MG disintegrating tablet Take 1 tablet (4 mg total) by mouth every 6 (six) hours as needed for nausea. 05/21/23   Almarie Waddell NOVAK, NP  SUMAtriptan  (IMITREX ) 50 MG tablet Take 1 tablet (50 mg total) by mouth every 2 (two) hours as needed for migraine. May repeat in 2 hours if headache persists or recurs. 04/07/23   Onita Duos, MD    Family History Family History  Problem Relation Age of Onset   Arthritis Mother    Asthma Mother    Hyperlipidemia Father    Allergic rhinitis Brother    Asthma Maternal Grandmother    Asthma Maternal Grandfather    Breast cancer Paternal Grandmother 57   Breast cancer Maternal Aunt    Colon cancer Neg Hx    Esophageal cancer Neg Hx    Rectal cancer Neg Hx     Social History Social History   Tobacco Use   Smoking status: Never    Passive exposure: Past   Smokeless tobacco: Never  Vaping Use   Vaping status: Never Used  Substance Use Topics   Alcohol use: Not Currently    Comment: social   Drug use: No     Allergies   Justicia adhatoda (malabar nut tree) [justicia adhatoda], Other, and Augmentin  [amoxicillin -pot  clavulanate]   Review of Systems Review of Systems  Constitutional:  Negative for chills and fever.  HENT:  Negative for ear pain and sore throat.   Eyes:  Negative for pain and visual disturbance.  Respiratory:  Negative for cough and shortness of breath.   Cardiovascular:  Negative for chest pain and palpitations.  Gastrointestinal:  Negative for abdominal pain and vomiting.  Genitourinary:  Negative for dysuria and hematuria.  Musculoskeletal:  Negative for arthralgias and back pain.       Left foot pain  Skin:  Negative for color change and rash.  Neurological:  Negative for seizures and syncope.  All other systems reviewed and are negative.    Physical Exam Triage Vital Signs ED Triage Vitals [04/17/24 1224]  Encounter Vitals Group     BP 111/74     Girls Systolic BP Percentile      Girls Diastolic BP Percentile      Boys Systolic BP Percentile      Boys Diastolic BP Percentile      Pulse Rate 95     Resp 18     Temp 99.1 F (37.3 C)     Temp src      SpO2 96 %     Weight      Height      Head Circumference      Peak Flow      Pain Score 5     Pain Loc      Pain Education      Exclude from Growth Chart    No data found.  Updated Vital Signs BP 111/74   Pulse 95   Temp 99.1 F (37.3 C)   Resp 18   LMP 03/19/2024 (Exact Date)   SpO2 96%   Visual Acuity Right Eye Distance:   Left Eye Distance:   Bilateral Distance:    Right Eye Near:   Left Eye Near:    Bilateral Near:     Physical Exam Vitals and nursing note reviewed.  Constitutional:      General: She is not in acute distress.    Appearance: She is well-developed.  HENT:     Head: Normocephalic and atraumatic.  Eyes:     Conjunctiva/sclera: Conjunctivae normal.  Cardiovascular:     Rate and Rhythm: Normal rate and regular rhythm.     Pulses:  Dorsalis pedis pulses are 2+ on the left side.       Posterior tibial pulses are 2+ on the left side.     Heart sounds: No murmur  heard. Pulmonary:     Effort: Pulmonary effort is normal. No respiratory distress.     Breath sounds: Normal breath sounds.  Abdominal:     Palpations: Abdomen is soft.     Tenderness: There is no abdominal tenderness.  Musculoskeletal:        General: No swelling.     Cervical back: Neck supple.     Left foot: Normal range of motion. No deformity, bunion or prominent metatarsal heads.       Feet:  Feet:     Left foot:     Skin integrity: Skin integrity normal.  Skin:    General: Skin is warm and dry.     Capillary Refill: Capillary refill takes less than 2 seconds.  Neurological:     Mental Status: She is alert.  Psychiatric:        Mood and Affect: Mood normal.      UC Treatments / Results  Labs (all labs ordered are listed, but only abnormal results are displayed) Labs Reviewed - No data to display  EKG   Radiology DG Foot Complete Left Result Date: 04/17/2024 EXAM: 3 OR MORE VIEW(S) XRAY OF THE LEFT FOOT 04/17/2024 01:01:59 PM COMPARISON: None available. CLINICAL HISTORY: Left foot pain especially around the medial first MT head FINDINGS: BONES AND JOINTS: No acute fracture. No focal osseous lesion. No joint dislocation. SOFT TISSUES: The soft tissues are unremarkable. IMPRESSION: 1. No significant abnormality. Electronically signed by: Dayne Hassell MD 04/17/2024 01:09 PM EST RP Workstation: [id number]    Procedures Procedures (including critical care time)  Medications Ordered in UC Medications - No data to display  Initial Impression / Assessment and Plan / UC Course  I have reviewed the triage vital signs and the nursing notes.  Pertinent labs & imaging results that were available during my care of the patient were reviewed by me and considered in my medical decision making (see chart for details).     Left foot pain - Plan: DG Foot Complete Left, DG Foot Complete Left  Neck pain on left side   X-ray of the left foot done today.  Final evaluation by  the radiologist does not show any fractures or other acute findings.  Symptoms and physical exam findings are most consistent with inflammation around the first distal metatarsal.  We can try to treat this with anti-inflammatories and rest and if there is no improvement may need to either follow-up with urgent care or with orthopedics.  We can try to treat with the following: Ibuprofen 600 to 800 mg every 8 hours as needed for pain. Ice the area 2-3 times daily for 10-15 minutes to help with pain and swelling. Do not apply ice directly to the skin.   Recommend wearing the postoperative shoe for the first 3 to 4 days when up standing or walking then may remove and increase activity as tolerated.  If you are unable to increase activity as tolerated then recommend following up with orthopedics May follow-up with urgent care as needed  Final Clinical Impressions(s) / UC Diagnoses   Final diagnoses:  Left foot pain     Discharge Instructions      X-ray of the left foot done today.  Final evaluation by the radiologist does not show any fractures or other acute findings.  Symptoms and physical exam findings are most consistent with inflammation around the first distal metatarsal.  We can try to treat this with anti-inflammatories and rest and if there is no improvement may need to either follow-up with urgent care or with orthopedics.  We can try to treat with the following: Ibuprofen 600 to 800 mg every 8 hours as needed for pain. Ice the area 2-3 times daily for 10-15 minutes to help with pain and swelling. Do not apply ice directly to the skin.   Recommend wearing the postoperative shoe for the first 3 to 4 days when up standing or walking then may remove and increase activity as tolerated.  If you are unable to increase activity as tolerated then recommend following up with orthopedics May follow-up with urgent care as needed     ED Prescriptions   None    PDMP not reviewed this  encounter.   Teresa Almarie LABOR, PA-C 04/17/24 1320

## 2024-04-17 NOTE — ED Triage Notes (Signed)
 Pt present with c/o lt foot pain x 3 days. Pt states she when she stretches her foot she feels pain and soreness.

## 2024-04-19 ENCOUNTER — Ambulatory Visit (INDEPENDENT_AMBULATORY_CARE_PROVIDER_SITE_OTHER): Admitting: Family Medicine

## 2024-04-19 VITALS — BP 110/72 | Ht 66.0 in | Wt 150.0 lb

## 2024-04-19 DIAGNOSIS — M79672 Pain in left foot: Secondary | ICD-10-CM

## 2024-04-19 NOTE — Patient Instructions (Signed)
 You strained your plantar fascia and irritated Baxter's nerve. Avoid flat shoes/barefoot walking as much as possible. Ibuprofen 600mg  three times a day with food - consider using for 7 days then as needed. Don't do exercises/stretches for this until next Monday. Icing 15 minutes at a time 3-4 times a day. Follow up with me in 2 weeks or as needed. Have fun at the concert!

## 2024-04-19 NOTE — Progress Notes (Signed)
 PCP: Almarie Waddell NOVAK, NP  Patient is a 23 y.o. female here for left foot pain.  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 The next day when doing calf stretches she felt a pop along the bottom of her foot from heel to great toe and then had tingling along the same distribution. Tingling stopped, but she continued to have pain to great toe/ball of foot when standing for long periods. Was seen in UC last week for this issue; xray at that time without any acute abnormality. Advised to use ibuprofen, ice, postop shoe.   Today she shares that she has not been wearing postop shoe as it was too big and made discomfort worse. She is not in severe pain but does have a concert tomorrow and will be standing for a long time. She wants to make sure this will not do more damage to her foot.  Past Medical History:  Diagnosis Date   Allergic conjunctivitis 11/10/2018   Allergy     seasonal   Anxiety    Asthma    Bursitis of intermetatarsal bursa of left foot 09/18/2018   Chronic headaches    Eczema    History of food allergy  11/10/2018   Kidney stone    Perennial allergic rhinitis 03/21/2015   Reactive airway disease 10/29/2018   Symptom of wheezing 10/29/2018   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   Urticaria    Well child visit 03/21/2015    Current Outpatient Medications on File Prior to Visit  Medication Sig Dispense Refill   cetirizine (ZYRTEC) 10 MG tablet Take 10 mg by mouth daily as needed.     cyclobenzaprine  (FLEXERIL ) 5 MG tablet Take 1 tablet (5 mg total) by mouth 3 (three) times daily as needed for muscle spasms. 30 tablet 1   Lactic Ac-Citric Ac-Pot Bitart (PHEXXI ) 1.8-1-0.4 % GEL Place 5 g vaginally as directed. Before intercourse 120 g 11   meloxicam  (MOBIC ) 15 MG tablet Take 1 tablet (15 mg total) by mouth daily. (Patient taking differently: Take 15 mg by mouth daily as needed.) 30 tablet 0   ondansetron  (ZOFRAN -ODT) 4 MG disintegrating tablet Take 1  tablet (4 mg total) by mouth every 6 (six) hours as needed for nausea. 20 tablet 0   SUMAtriptan  (IMITREX ) 50 MG tablet Take 1 tablet (50 mg total) by mouth every 2 (two) hours as needed for migraine. May repeat in 2 hours if headache persists or recurs. 10 tablet 6   No current facility-administered medications on file prior to visit.    Past Surgical History:  Procedure Laterality Date   WISDOM TOOTH EXTRACTION      Allergies  Allergen Reactions   Justicia Adhatoda (Malabar Nut Tree) [Justicia Adhatoda] Hives   Other     TREE NUTS   Augmentin  [Amoxicillin -Pot Clavulanate] Rash    BP 110/72   Ht 5' 6 (1.676 m)   Wt 150 lb (68 kg)   LMP 03/19/2024 (Exact Date)   BMI 24.21 kg/m       No data to display              No data to display              Objective:  Physical Exam: Gen: NAD, comfortable in exam room  Left Foot Inspection: No bruising, edema, or other obvious trauma to the foot. Palpation: Some TTP of the plantar surface of foot just proximal to first MTP joint, otherwise no  pain with palpation.  ROM: intact pain-free plantarflexion/dorsiflexion and inversion/eversion, 5/5 strength. Special tests: negative calcaneal squeeze  Assessment and Plan:  Left foot pain - most likely plantar fascia strain, irritation of Baxter's nerve. Exam reassuring against plantar fascia rupture. She would benefit from shoe inserts, provided today. Discussed ice as needed, but she will avoid stretches/exercises for 1 week to allow healing. No reason to advise against going to concert tomorrow as long as she wears supportive shoes and feels comfortable. Return for follow up as needed in 4 weeks if not resolving.
